# Patient Record
Sex: Male | Born: 1995 | Race: Black or African American | Hispanic: No | Marital: Single | Smoking: Never smoker
Health system: Southern US, Community
[De-identification: ages and names within clinical notes are randomized; demographics above are authoritative.]

---
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
## 2015-04-08 ENCOUNTER — Ambulatory Visit (HOSPITAL_COMMUNITY)
Admission: RE | Admit: 2015-04-08 | Discharge: 2015-04-08 | Disposition: A | Discharge status: Home / Self Care | Payer: BLUE CROSS/BLUE SHIELD | Source: Ambulatory Visit | Attending: Family Medicine | Admitting: Family Medicine

## 2015-04-08 ENCOUNTER — Other Ambulatory Visit (HOSPITAL_COMMUNITY): Payer: Self-pay | Admitting: Family Medicine

## 2015-04-08 DIAGNOSIS — M79675 Pain in left toe(s): Secondary | ICD-10-CM | POA: Insufficient documentation

## 2018-07-16 HISTORY — PX: ANTERIOR CRUCIATE LIGAMENT REPAIR: SHX115

## 2018-08-08 ENCOUNTER — Telehealth (HOSPITAL_COMMUNITY): Payer: Self-pay

## 2018-08-08 NOTE — Telephone Encounter (Signed)
I attempted to call [name] at the phone number on file and was unable to leave a voice message. [name] recently underwent Lt ACL reconstruction and requires and evaluation to assess current function and provide HEP for early rehab based on protocol. I will continue to attempt to call patient to schedule the evaluation.   Nathaniel [name], PT, DPT, [facility] Physical Therapist with [facility]  08/08/2018 12:16 PM

## 2018-08-10 ENCOUNTER — Telehealth (HOSPITAL_COMMUNITY): Payer: Self-pay

## 2018-08-10 NOTE — Telephone Encounter (Signed)
2nd attempt to schedule evaluation: I attempted to call Mr. Catlow at the phone number on file and was unable to leave a voice message. Mr. Osmani recently underwent Lt ACL reconstruction and requires and evaluation to assess current function and provide HEP for early rehab based on protocol. I will continue to attempt to call patient to schedule the evaluation.   Valentino Saxon, PT, DPT, [facility] Physical Therapist with [facility]  08/10/2018 11:29 AM

## 2018-08-13 ENCOUNTER — Encounter (HOSPITAL_COMMUNITY): Payer: Self-pay

## 2018-08-13 ENCOUNTER — Ambulatory Visit (HOSPITAL_COMMUNITY): Payer: BLUE CROSS/BLUE SHIELD | Attending: Physician Assistant

## 2018-08-13 ENCOUNTER — Other Ambulatory Visit: Payer: Self-pay

## 2018-08-13 DIAGNOSIS — M25662 Stiffness of left knee, not elsewhere classified: Secondary | ICD-10-CM | POA: Diagnosis present

## 2018-08-13 DIAGNOSIS — M25562 Pain in left knee: Secondary | ICD-10-CM | POA: Diagnosis present

## 2018-08-13 DIAGNOSIS — M6281 Muscle weakness (generalized): Secondary | ICD-10-CM | POA: Diagnosis present

## 2018-08-13 NOTE — Therapy (Signed)
[facility] [address] [place], [place], [address] Phone: [phone]   Fax:  [phone]  Physical Therapy Evaluation  Patient Details  Name: Nathaniel [name] MRN: [id number] Date of Birth: Feb 12, 1996 Referring Provider (PT): Durwin Nora Katharina Caper, [place]   Encounter Date: 08/13/2018  PT End of Session - 08/13/18 1507    Visit Number  1    Number of Visits  12    Date for PT Re-Evaluation  10/08/18    Authorization Type  [employer] Other (no auth required, 60 visits per year PT/OT combined, $20 co-pay)    Authorization Time Period  08/13/18 -    mini-re-assess 09/10/18   Authorization - Visit Number  1    Authorization - Number of Visits  60    PT Start Time  1408    PT Stop Time  1500    PT Time Calculation (min)  52 min    Activity Tolerance  Patient tolerated treatment well;No increased pain    Behavior During Therapy  [facility] for tasks assessed/performed       History reviewed. No pertinent past medical history.  Past Surgical History:  Procedure Laterality Date  . ANTERIOR CRUCIATE LIGAMENT REPAIR Left 07/16/2018    There were no vitals filed for this visit.   Subjective Assessment - 08/13/18 1413    Subjective  Nathaniel [name] denies pain today and reports he is been using ibuprofen for pain management currently. He reports he underwent Lt ACL reconstruction on 07/16/18 with a hamstring autograft. He states his injury occurred back in December at work when stepping out of his [employer] truck. He states he did not notice severe pain at the time and continued to work and go to the gym for several weeks. He states one day his knee buckled and he felt something was not right. He had an MRI and based on his activity level was recommended to have surgery. Nathaniel [name] reports he was going to the gym every day to lift weights and play basketball occasionally. 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 He reports he saw his MD last week for a 3 week follow up and they have instructed him to continue wearing his brace while walking as well as use the crutches and to wear the brace at night. He was given ankle pumps as his exercise after surgery and nothing else. He has not participated in home health PT.     Pertinent History  LT ACL Reconstruction hamstring autograft 07/16/18    Limitations  Sitting;House hold activities;Standing;Walking    Patient Stated Goals  to get back to work and the gym    Currently in Pain?  No/denies    Pain Score  0-No pain         [facility] PT Assessment - 08/13/18 0001      Assessment   Medical Diagnosis  Lt ACL Reconstruction with Hamstring Autograft    Referring Provider (PT)  Colvin Caroli, PA-C    Onset Date/Surgical Date  07/16/18    Next MD Visit  08/28/2018    Prior Therapy  none      Precautions   Precautions  Other (comment);Knee    Precaution Comments  [facility] ACL Autograft Protocol      Restrictions   Weight Bearing Restrictions  Yes    LLE Weight Bearing  Weight bearing as tolerated  Other Position/Activity Restrictions  ambulate with crutches for 3 more weeks      Balance Screen   Has the patient fallen in the past 6 months  No      Home Environment   Living Environment  Private residence    Living Arrangements  Parent;Other relatives      Prior Function   Level of Independence  Independent    Vocation  Full time employment    Vocation Requirements  Patient was working at [employer]     Leisure  Patient was going to the gym almost every day to lift weight. He also enjoys basketball and does sprint/power drills on fields occasionally      Cognition   Overall Cognitive Status  Within Functional Limits for tasks assessed      Observation/Other Assessments   Skin Integrity  Incisions appear healed and intact this date.    Focus on Therapeutic Outcomes (FOTO)   76% limited      Observation/Other  Assessments-Edema    Edema  Circumferential      Circumferential Edema   Circumferential - Right  40 cm at joint line    Circumferential - Left   41.5 cm at joint line      Posture/Postural Control   Posture/Postural Control  No significant limitations      ROM / Strength   AROM / PROM / Strength  AROM;Strength      AROM   Overall AROM Comments  Rt limited by muscle bulk of hamstring and calf    AROM Assessment Site  Knee    Right/Left Knee  Left;Right    Right Knee Extension  0    Right Knee Flexion  131    Left Knee Extension  10    Left Knee Flexion  85      Strength   Strength Assessment Site  Knee;Hip;Ankle    Right Hip Flexion  5/5    Right Hip Extension  5/5    Right Hip ABduction  5/5    Left Hip Flexion  --   unable to test due to extensor lag   Left Hip Extension  4/5    Left Hip ABduction  4/5    Right/Left Knee  Right;Left    Right Knee Flexion  5/5    Right Knee Extension  5/5    Left Knee Flexion  2+/5    Left Knee Extension  --   not tested   Right Ankle Dorsiflexion  5/5    Left Ankle Dorsiflexion  4/5      Palpation   Patella mobility  hypomobile on Lt knee         Objective measurements completed on examination: See above findings.      [facility] Adult PT Treatment/Exercise - 08/13/18 0001      Exercises   Exercises  Knee/Hip      Knee/Hip Exercises: Stretches   Knee: Self-Stretch to increase Flexion  Left;10 seconds;Limitations    Knee: Self-Stretch Limitations  10 reps, supine with rope      Knee/Hip Exercises: Aerobic   Stationary Bike  5 minutes half revolutions on seat 21 for AAROM to Lt knee      Knee/Hip Exercises: Supine   Quad Sets  Left;1 set;10 reps    Quad Sets Limitations  towel under knee, 5 sec holds    Heel Slides  Left;1 set;10 reps      Knee/Hip Exercises: Sidelying   Hip ABduction  Left;1 set;10 reps  Knee/Hip Exercises: Prone   Hamstring Curl  1 set;10 reps;3 seconds   Lt LE   Hip Extension  Left;1 set;10  reps    Hip Extension Limitations  3 sec holds        PT Education - 08/13/18 1510    Education Details  Educated on Garrisonevlauaiton and process of ACL rehab. Discussed that patient is in protection phase to be cautious with strengthening and to focus on ROM and isolated strengthening. Educated on best plan for therapy at this time and provided HEP to perform at home.     Person(s) Educated  Patient    Methods  Explanation;Handout;Demonstration    Comprehension  Verbalized understanding;Returned demonstration       PT Short Term Goals - 08/13/18 1544      PT SHORT TERM GOAL #1   Title  Patient will be independent with HEP, updated PRN, toprogress safely through rehab protocol.     Time  1    Period  Weeks    Status  New    Target Date  08/20/18      PT SHORT TERM GOAL #2   Title  Patient will achieve AROM 0-100 to progress safely to next phase of protocol for strengthening and mobility/ROM exercises.     Time  3    Period  Weeks    Status  New    Target Date  09/03/18      PT SHORT TERM GOAL #3   Title  Patient will demonstrate normal gait pattern with no brace ro crutches to indicate good quad control and tolerance to FWB to improve mobility in daily activities and progress to next phase of protocol.     Time  4    Period  Weeks    Status  New    Target Date  09/10/18        PT Long Term Goals - 08/13/18 1549      PT LONG TERM GOAL #1   Title  Patient will achieve 0-130 degrees on Lt knee AROM to equal Rt Knee mobility to imrpove fucntional giat and mobility.     Time  6    Period  Weeks    Status  New    Target Date  09/24/18      PT LONG TERM GOAL #2   Title  Patient will demosntrate 70% hamstring strength on Lt knee compared to Rt knee to indicate imrpovements in functional strength with 1 rep max test.     Time  8    Period  Weeks    Status  New    Target Date  10/08/18      PT LONG TERM GOAL #3   Title  Patient will demosntrate 60% quadriceps strength on Lt  knee compared to Rt knee to indicate imrpovements in functional strength with 1 rep max test.     Time  8    Period  Weeks    Status  New    Target Date  10/08/18        Plan - 08/13/18 1513    Clinical Impression Statement  Nathaniel [name] presents to physical therapy for evaluation 4 weeks s/p Lt ACL reconstruction with hamstring autograft. He presents with significant atrophy to Lt quadriceps, hamstring, and gastrocsoleus complex. He has been in immobilizer for 4 weeks and ambulating with crutches and has been instructed to continue WBAT with immobilizer and crutches for additional 3 weeks. Lt knee AROM is currently limited form  10-85 degrees and he has mild edema present at Lt knee. His incisions are healing well and no erythema or warmth is present at joint. He was educated on initial HEP to address ROM deficits and initiate quad, hamstrings, and hip strengthening within protocol. He will be seen 1x/week in clinic due to reduced clinic hours related to COVID-19 spread and will increase frequency as he progresses towards more advanced strengthening phases. Nathaniel [name] will benefit from skilled PT interventions to progress safely through ACL Reconstruction protocol to return to work and recreational exercises routine.    Personal Factors and Comorbidities  Profession    Examination-Activity Limitations  Bathing;Stairs;Sit;Stand;Transfers    Examination-Participation Restrictions  Driving;Yard Work;Cleaning;Community Activity;Laundry;Other   work   Stability/Clinical Decision Making  Stable/Uncomplicated    Clinical Decision Making  Low    Rehab Potential  Excellent    PT Frequency  2x / week   1x/week for 4, 2x/week for 4   PT Duration  8 weeks    PT Treatment/Interventions  ADLs/Self Care Home Management;Aquatic Therapy;Cryotherapy;Electrical Stimulation;Moist Heat;DME Instruction;Gait training;Stair training;Functional mobility training;Therapeutic activities;Therapeutic exercise;Balance  training;Neuromuscular re-education;Patient/family education;Manual techniques;Scar mobilization;Passive range of motion;Taping    PT Next Visit Plan  Review HEP and continue with bike for AROM. 4 weeks post-op on 08/13/18; follow [facility] orthopedic ACL Autograft protocol. (Initiate patella mobs, retrograde massage, quad set with e-stim) add prone knee hang to HEP for knee extension.    PT Home Exercise Plan  Eval: quad set, heel slide, supine knee flexion stretch with rope, hip extension, hamsring curl, hip abduction, heel raises    Consulted and Agree with Plan of Care  Patient       Patient will benefit from skilled therapeutic intervention in order to improve the following deficits and impairments:  Abnormal gait, Decreased activity tolerance, Decreased endurance, Decreased range of motion, Decreased strength, Increased edema, Difficulty walking, Decreased mobility, Decreased balance, Increased fascial restricitons  Visit Diagnosis: Stiffness of left knee, not elsewhere classified  Acute pain of left knee  Muscle weakness (generalized)     Problem List Patient Active Problem List   Diagnosis Date Noted  . CONTUSION OF FOREARM 03/17/2009    Valentino Saxon, PT, DPT, [facility] Physical Therapist with [facility]  08/13/2018 4:00 PM    [facility] [address] [place], [place], [address] Phone: [phone]   Fax:  [phone]  Name: CAROLD EISNER MRN: [id number] Date of Birth: [birth year]

## 2018-08-15 ENCOUNTER — Ambulatory Visit (HOSPITAL_COMMUNITY): Payer: BLUE CROSS/BLUE SHIELD

## 2018-08-17 ENCOUNTER — Ambulatory Visit (HOSPITAL_COMMUNITY): Payer: BLUE CROSS/BLUE SHIELD | Admitting: Physical Therapy

## 2018-08-20 ENCOUNTER — Encounter (HOSPITAL_COMMUNITY): Payer: Self-pay

## 2018-08-20 ENCOUNTER — Ambulatory Visit (HOSPITAL_COMMUNITY): Payer: BLUE CROSS/BLUE SHIELD | Attending: Physician Assistant

## 2018-08-20 ENCOUNTER — Other Ambulatory Visit: Payer: Self-pay

## 2018-08-20 DIAGNOSIS — M25562 Pain in left knee: Secondary | ICD-10-CM | POA: Diagnosis present

## 2018-08-20 DIAGNOSIS — M6281 Muscle weakness (generalized): Secondary | ICD-10-CM | POA: Diagnosis present

## 2018-08-20 DIAGNOSIS — M25662 Stiffness of left knee, not elsewhere classified: Secondary | ICD-10-CM | POA: Diagnosis not present

## 2018-08-20 NOTE — Therapy (Signed)
[facility] [address] [place], [place], [address] Phone: [phone]   Fax:  [phone]  Physical Therapy Treatment  Patient Details  Name: Nathaniel [name] MRN: [id number] Date of Birth: 02/24/1996 Referring Provider (PT): Durwin Nora Katharina Caper, [place]   Encounter Date: 08/20/2018  PT End of Session - 08/20/18 1157    Visit Number  2    Number of Visits  12    Date for PT Re-Evaluation  10/08/18    Authorization Type  BC[employer] Other (no auth required, 60 visits per year PT/OT combined, $20 co-pay)    Authorization Time Period  08/13/18 - 10/08/18   mini-re-assess 09/10/18   Authorization - Visit Number  2    Authorization - Number of Visits  60    PT Start Time  1120    PT Stop Time  1203    PT Time Calculation (min)  43 min    Activity Tolerance  Patient tolerated treatment well;No increased pain    Behavior During Therapy  [facility] for tasks assessed/performed       History reviewed. No pertinent past medical history.  Past Surgical History:  Procedure Laterality Date  . ANTERIOR CRUCIATE LIGAMENT REPAIR Left 07/16/2018    There were no vitals filed for this visit.  Subjective Assessment - 08/20/18 1123    Subjective  Patient reports he is doing his HEP 2x/day and he denies any pain. He is elevating and icing his knee frequently throughout the day.     Pertinent History  LT ACL Reconstruction hamstring autograft 07/16/18    Limitations  Sitting;House hold activities;Standing;Walking    Patient Stated Goals  to get back to work and the gym    Currently in Pain?  No/denies       [facility] Adult PT Treatment/Exercise - 08/20/18 0001      Exercises   Exercises  Knee/Hip      Knee/Hip Exercises: Aerobic   Stationary Bike  5 minutes half revolutions on seat 21 for AAROM to Lt knee      Knee/Hip Exercises: Seated   Other Seated Knee/Hip Exercises  Hamstring isometric: Lt LE, 1x 10 reps 10 sec holds      Knee/Hip Exercises: Supine    Quad Sets  Strengthening;Left;1 set   12 reps (during NMES)   Quad Sets Limitations  towel under knee    Heel Slides  Left;1 set;15 reps    Knee Extension  AROM    Knee Extension Limitations  5    Knee Flexion  AROM    Knee Flexion Limitations  86      Knee/Hip Exercises: Prone   Hamstring Curl  1 set;3 seconds;15 reps    Hamstring Curl Limitations  Lt LE    Hip Extension  Left;1 set;15 reps    Hip Extension Limitations  Lt LE, 3 sec holds      Modalities   Modalities  Geologist, engineering Location  Lt Quadriceps    Electrical Stimulation Action  Neuromuscular re-education and strenghtening during supine quad set with towel under knee    Electrical Stimulation Parameters  8 minutes: [place], con-contract (2 channels), 17.0 AmpCC, 50% duty cycle, 10/30 on/off    Electrical Stimulation Goals  Strength;Neuromuscular facilitation      Manual Therapy   Manual Therapy  Joint mobilization    Manual therapy comments  performed seperate from other interventions    Joint Mobilization  Lt patellofemoral joint mobilization; 3x 30 sec each direction (med/lat, inf/sup); self instruction for patient to perform at home for HEP        PT Education - 08/20/18 1200    Education Details  Educated on exercises throughout and on HEP update.    Person(s) Educated  Patient    Methods  Explanation;Demonstration;Handout    Comprehension  Verbalized understanding;Returned demonstration       PT Short Term Goals - 08/13/18 1544      PT SHORT TERM GOAL #1   Title  Patient will be independent with HEP, updated PRN, toprogress safely through rehab protocol.     Time  1    Period  Weeks    Status  New    Target Date  08/20/18      PT SHORT TERM GOAL #2   Title  Patient will achieve AROM 0-100 to progress safely to next phase of protocol for strengthening and mobility/ROM exercises.     Time  3    Period  Weeks    Status  New    Target Date   09/03/18      PT SHORT TERM GOAL #3   Title  Patient will demonstrate normal gait pattern with no brace ro crutches to indicate good quad control and tolerance to FWB to improve mobility in daily activities and progress to next phase of protocol.     Time  4    Period  Weeks    Status  New    Target Date  09/10/18        PT Long Term Goals - 08/13/18 1549      PT LONG TERM GOAL #1   Title  Patient will achieve 0-130 degrees on Lt knee AROM to equal Rt Knee mobility to imrpove fucntional giat and mobility.     Time  6    Period  Weeks    Status  New    Target Date  09/24/18      PT LONG TERM GOAL #2   Title  Patient will demosntrate 70% hamstring strength on Lt knee compared to Rt knee to indicate imrpovements in functional strength with 1 rep max test.     Time  8    Period  Weeks    Status  New    Target Date  10/08/18      PT LONG TERM GOAL #3   Title  Patient will demosntrate 60% quadriceps strength on Lt knee compared to Rt knee to indicate imrpovements in functional strength with 1 rep max test.     Time  8    Period  Weeks    Status  New    Target Date  10/08/18        Plan - 08/20/18 1356    Clinical Impression Statement  Reviewed exercises for HEP and continued focus for quad strengtheing with quad set using NMES to facilitate greater muscle activaiton. Performe dexercies focused on ROM deficits and hamstring strengthening. Introduced hamsring isometric which patient reported was easier than OKC in prone, and educated patient on how to perform self patella mobilizations. He will continue to benefit from skilled PT Interventions toa ddress impairments and progress through protocol safely.     Personal Factors and Comorbidities  Profession    Examination-Activity Limitations  Bathing;Stairs;Sit;Stand;Transfers    Examination-Participation Restrictions  Driving;Yard Work;Cleaning;Community Activity;Laundry;Other   work   Stability/Clinical Decision Making   Stable/Uncomplicated    Rehab Potential  Excellent  PT Frequency  2x / week   1x/week for 4, 2x/week for 4   PT Duration  8 weeks    PT Treatment/Interventions  ADLs/Self Care Home Management;Aquatic Therapy;Cryotherapy;Electrical Stimulation;Moist Heat;DME Instruction;Gait training;Stair training;Functional mobility training;Therapeutic activities;Therapeutic exercise;Balance training;Neuromuscular re-education;Patient/family education;Manual techniques;Scar mobilization;Passive range of motion;Taping    PT Next Visit Plan  Review HEP and continue with bike for AROM. 5 weeks post-op on 08/20/18; follow [facility] orthopedic ACL Autograft protocol. Follow up on patella mobs. Perform retrograde massage PRN, continue quad set with e-stim. Progress per protocol.    PT Home Exercise Plan  Eval: quad set, heel slide, supine knee flexion stretch with rope, hip extension, hamsring curl, hip abduction, heel raises; 08/20/18 - patella mobilization, hamstring isometric, prone knee hang    Consulted and Agree with Plan of Care  Patient       Patient will benefit from skilled therapeutic intervention in order to improve the following deficits and impairments:  Abnormal gait, Decreased activity tolerance, Decreased endurance, Decreased range of motion, Decreased strength, Increased edema, Difficulty walking, Decreased mobility, Decreased balance, Increased fascial restricitons  Visit Diagnosis: Stiffness of left knee, not elsewhere classified  Acute pain of left knee  Muscle weakness (generalized)     Problem List Patient Active Problem List   Diagnosis Date Noted  . CONTUSION OF FOREARM 03/17/2009    Valentino Saxon, PT, DPT, [facility] Physical Therapist with [facility]  08/20/2018 2:03 PM    [facility] [address] [place], [place], [address] Phone: [phone]   Fax:  [phone]  Name: BRANKO ALLOR MRN:  [id number] Date of Birth: [birth year]

## 2018-08-20 NOTE — Patient Instructions (Addendum)
Long Sitting 4 Way Patellar Glide reps: 3 hold: 60 seconds each direction daily: 2 weekly: 7   Exercise image step 1   Exercise image step 2   Exercise image step 3   Exercise image step 4   Exercise image step 5  Setup  Begin sitting upright with your legs straight. Movement  Place your fingers around your kneecap and gently move it inward. Hold briefly, then return to the starting position and repeat moving your knee cap outward, up, then down. Tip  Make sure to keep your leg muscles relaxed during the exercise. Prone Knee Extension Hang hold: 10 minutes daily: 1-2 weekly: 7   Exercise image step 1  Setup  Begin lying on your front with your knee and lower leg hanging off the edge of a table or bed Movement  Let your lower leg hang toward the floor, straightening your knee. Tip  Make sure not to arch your back during the exercise.   Seated Hamstring Set reps: 10 sets: 2 hold: 8 seconds daily: 1-2  weekly: 7      Exercise image step 1   Exercise image step 2  Setup  Begin sitting upright on the edge of a chair with your involved knee slightly bent. Movement  Pull your heel down into the ground tightening the muscles in the back of your thigh. Hold briefly, then relax and repeat. Tip  Make sure to keep your back straight and do not let your knee fall inward or outward.

## 2018-08-22 ENCOUNTER — Encounter (HOSPITAL_COMMUNITY): Payer: BLUE CROSS/BLUE SHIELD

## 2018-08-27 ENCOUNTER — Encounter (HOSPITAL_COMMUNITY): Payer: Self-pay | Admitting: Physical Therapy

## 2018-08-27 ENCOUNTER — Ambulatory Visit (HOSPITAL_COMMUNITY): Payer: BLUE CROSS/BLUE SHIELD | Admitting: Physical Therapy

## 2018-08-27 ENCOUNTER — Other Ambulatory Visit: Payer: Self-pay

## 2018-08-27 DIAGNOSIS — M6281 Muscle weakness (generalized): Secondary | ICD-10-CM

## 2018-08-27 DIAGNOSIS — M25562 Pain in left knee: Secondary | ICD-10-CM

## 2018-08-27 DIAGNOSIS — M25662 Stiffness of left knee, not elsewhere classified: Secondary | ICD-10-CM | POA: Diagnosis not present

## 2018-08-27 NOTE — Patient Instructions (Addendum)
Heel Raise: Bilateral (Standing)    Rise on balls of feet. Repeat _10-15___ times per set. Do _1___ sets per session. Do _2___ sessions per day.  http://orth.exer.us/38   Copyright  VHI. All rights reserved.  Functional Quadriceps: Chair Squat    Keeping feet flat on floor, shoulder width apart, squat as low as is comfortable. Use support as necessary. Repeat __10__ times per set. Do __1__ sets per session. Do __2__ sessions per day.  http://orth.exer.us/736   Copyright  VHI. All rights reserved.  Balance: Unilateral    Attempt to balance on left leg, eyes open. Hold ____ seconds. Repeat ____ times per set. Do ____ sets per session. Do ____ sessions per day. Perform exercise with eyes closed.  http://orth.exer.us/28   Copyright  VHI. All rights reserved.  Knee Flexion: Resisted (Standing)    With support, __0__ pound weight around right ankle, slowly bend knee up. Return slowly.  Repeat _10___ times per set. Do 1____ sets per session. Do __2__ sessions per day.  http://orth.exer.us/740   Copyright  VHI. All rights reserved.  Knee Extension Mobilization: Towel Prop    With rolled towel under right ankle, place _2___ pound weight across knee. Hold __5-30__ minutes. Repeat ___1_ times per set. Do __1__ sets per session. Do __2_ sessions per day.  http://orth.exer.us/720   Copyright  VHI. All rights reserved.

## 2018-08-27 NOTE — Therapy (Signed)
Nathaniel [name], [place], [address] Phone: [phone]   Fax:  [phone]  Physical Therapy Treatment  Patient Details  Name: Nathaniel [name] MRN: [id number] Date of Birth: 1995/08/24 Referring Provider (PT): Doren Custard Robb Matar, [place]   Encounter Date: 08/27/2018  PT End of Session - 08/27/18 1354    Visit Number  3    Number of Visits  12    Date for PT Re-Evaluation  10/08/18    Authorization Type  BCBS Other ([name] auth required, 60 visits per year PT/OT combined, $20 co-pay)    Authorization Time Period  08/13/18 - 10/08/18   mini-re-assess 09/10/18   Authorization - Visit Number  3    Authorization - Number of Visits  60    PT Start Time  1300    PT Stop Time  1404    PT Time Calculation (min)  64 min    Activity Tolerance  Patient tolerated treatment well;[name] increased pain    Behavior During Therapy  [facility] for tasks assessed/performed       History reviewed. [name] pertinent past medical history.  Past Surgical History:  Procedure Laterality Date  . ANTERIOR CRUCIATE LIGAMENT REPAIR Left 07/16/2018    There were [name] vitals filed for this visit.  Subjective Assessment - 08/27/18 1301    Subjective  Pt states that his knee is sore every now and the  but it is feeling better.     Pertinent History  LT ACL Reconstruction hamstring autograft 07/16/18    Limitations  Sitting;House hold activities;Standing;Walking    Patient Stated Goals  to get back to work and the gym    Currently in Pain?  [name]/denies                 [facility] Adult PT Treatment/Exercise - 08/27/18 0001      Exercises   Exercises  Knee/Hip      Knee/Hip Exercises: Stretches   Quad Stretch  Left;2 reps;20 seconds      Knee/Hip Exercises: Standing   Heel Raises  Both;10 reps    Knee Flexion  Left;10 reps    Functional Squat  10 reps    SLS  x3 B       Knee/Hip Exercises: Supine   Quad Sets  2 sets;15 reps    Heel Slides  Left;15  reps    Knee Extension  AROM    Knee Extension Limitations  3    Knee Flexion  AROM    Knee Flexion Limitations  96      Knee/Hip Exercises: Prone   Hamstring Curl  10 reps      Modalities   Modalities  Electrical Stimulation      Electrical Stimulation   Electrical Stimulation Location  Lt Quadriceps   All 4 electrodes with co contraction.   Social research officer, government Parameters  russian stim x 15'    Printmaker Goals  Strength;Neuromuscular facilitation      Manual Therapy   Manual Therapy  Joint mobilization    Manual therapy comments  performed seperate from other interventions    Joint Mobilization  Lt patellofemoral joint mobilization; 3x 30 sec each direction (med/lat, inf/sup); self instruction for patient to perform at home for HEP               PT Short Term Goals - 08/27/18 1401      PT SHORT TERM  GOAL #1   Title  Patient will be independent with HEP, updated PRN, toprogress safely through rehab protocol.     Time  1    Period  Weeks    Status  On-going    Target Date  08/20/18      PT SHORT TERM GOAL #2   Title  Patient will achieve AROM 0-100 to progress safely to next phase of protocol for strengthening and mobility/ROM exercises.     Time  3    Period  Weeks    Status  Not Met    Target Date  09/03/18      PT SHORT TERM GOAL #3   Title  Patient will demonstrate normal gait pattern with [name] brace ro crutches to indicate good quad control and tolerance to FWB to improve mobility in daily activities and progress to next phase of protocol.     Time  4    Period  Weeks    Status  Not Met    Target Date  09/10/18        PT Long Term Goals - 08/27/18 1401      PT LONG TERM GOAL #1   Title  Patient will achieve 0-130 degrees on Lt knee AROM to equal Rt Knee mobility to imrpove fucntional giat and mobility.     Time  6    Period  Weeks    Status  Not Met      PT LONG TERM GOAL #2   Title   Patient will demosntrate 70% hamstring strength on Lt knee compared to Rt knee to indicate imrpovements in functional strength with 1 rep max test.     Time  8    Period  Weeks    Status  Not Met      PT LONG TERM GOAL #3   Title  Patient will demosntrate 60% quadriceps strength on Lt knee compared to Rt knee to indicate imrpovements in functional strength with 1 rep max test.     Time  8    Period  Weeks    Status  Not Met            Plan - 08/27/18 1355    Clinical Impression Statement  Pt returns with limited improvement in ROM and noted quadricep atrophy.  PT given instructions that he needs to improve his flexion more rapidly; also instructed to complete 100 quad sets per day to offset current atrophy.  Pt is returning to MD tomorrow and hopefully will be able to decrease use of crutches .  Pt continues to need skilled therapy to improve ROM, strength and normalize gait.  If pt is not at 105 or higher by next visit we will have pt  come in twice a week.      Personal Factors and Comorbidities  Profession    Examination-Activity Limitations  Bathing;Stairs;Sit;Stand;Transfers    Examination-Participation Restrictions  Driving;Yard Work;Cleaning;Community Activity;Laundry;Other   work   Stability/Clinical Decision Making  Stable/Uncomplicated    Rehab Potential  Excellent    PT Frequency  2x / week   1x/week for 4, 2x/week for 4   PT Duration  8 weeks    PT Treatment/Interventions  ADLs/Self Care Home Management;Aquatic Therapy;Cryotherapy;Electrical Stimulation;Moist Heat;DME Instruction;Gait training;Stair training;Functional mobility training;Therapeutic activities;Therapeutic exercise;Balance training;Neuromuscular re-education;Patient/family education;Manual techniques;Scar mobilization;Passive range of motion;Taping    PT Next Visit Plan   continue with bike for AROM. 5 weeks post-op on 08/20/18; follow [facility] orthopedic ACL Autograft protocol. Follow up on  patella mobs.  Perform retrograde massage PRN, continue quad set with e-stim. Progress per protocol.    PT Home Exercise Plan  Eval: quad set, heel slide, supine knee flexion stretch with rope, hip extension, hamsring curl, hip abduction, heel raises; 08/20/18 - patella mobilization, hamstring isometric, prone knee hang    Consulted and Agree with Plan of Care  Patient       Patient will benefit from skilled therapeutic intervention in order to improve the following deficits and impairments:  Abnormal gait, Decreased activity tolerance, Decreased endurance, Decreased range of motion, Decreased strength, Increased edema, Difficulty walking, Decreased mobility, Decreased balance, Increased fascial restricitons  Visit Diagnosis: Stiffness of left knee, not elsewhere classified  Acute pain of left knee  Muscle weakness (generalized)     Problem List Patient Active Problem List   Diagnosis Date Noted  . CONTUSION OF FOREARM 03/17/2009  Rayetta Humphrey, PT CLT [phone] 08/27/2018, 2:02 PM  [place] [address] [place], [place], [address] Phone: [phone]   Fax:  [phone]  Name: Nathaniel [name] MRN: [id number] Date of Birth: Feb 27, 1996

## 2018-09-03 ENCOUNTER — Other Ambulatory Visit: Payer: Self-pay

## 2018-09-03 ENCOUNTER — Encounter (HOSPITAL_COMMUNITY): Payer: Self-pay | Admitting: Physical Therapy

## 2018-09-03 ENCOUNTER — Ambulatory Visit (HOSPITAL_COMMUNITY): Payer: BLUE CROSS/BLUE SHIELD | Admitting: Physical Therapy

## 2018-09-03 DIAGNOSIS — M25662 Stiffness of left knee, not elsewhere classified: Secondary | ICD-10-CM | POA: Diagnosis not present

## 2018-09-03 DIAGNOSIS — M25562 Pain in left knee: Secondary | ICD-10-CM

## 2018-09-03 DIAGNOSIS — M6281 Muscle weakness (generalized): Secondary | ICD-10-CM

## 2018-09-03 NOTE — Therapy (Signed)
[place], [place], [address] Phone: [phone]   Fax:  [phone]  Physical Therapy Treatment  Patient Details  Name: Nathaniel [name] MRN: [id number] Date of Birth: [birth year] Referring Provider (PT): Doren Custard Robb Matar, [place]   Encounter Date: 09/03/2018  PT End of Session - 09/03/18 1438    Visit Number  4    Number of Visits  12    Date for PT Re-Evaluation  10/08/18    Authorization Type  [employer] Other (no auth required, 60 visits per year PT/OT combined, $20 co-pay)    Authorization Time Period  08/13/18 - 10/08/18   mini-re-assess 09/10/18   Authorization - Visit Number  4    Authorization - Number of Visits  60    PT Start Time  7915    PT Stop Time  1400    PT Time Calculation (min)  54 min    Activity Tolerance  Patient tolerated treatment well;No increased pain    Behavior During Therapy  [facility] for tasks assessed/performed       History reviewed. No pertinent past medical history.  Past Surgical History:  Procedure Laterality Date  . ANTERIOR CRUCIATE LIGAMENT REPAIR Left 07/16/2018    There were no vitals filed for this visit.  Subjective Assessment - 09/03/18 1313    Subjective  no pain or issues, just a little soreness    Currently in Pain?  No/denies                       [facility] Adult PT Treatment/Exercise - 09/03/18 0001      Knee/Hip Exercises: Stretches   Active Hamstring Stretch  Left;3 reps;30 seconds    Active Hamstring Stretch Limitations  12" box standing    Knee: Self-Stretch to increase Flexion  Left;10 seconds    Knee: Self-Stretch Limitations  10 reps on 12" step      Knee/Hip Exercises: Aerobic   Stationary Bike  5 minutes half revolutions on seat 21 for AAROM to Lt knee      Knee/Hip Exercises: Machines for Strengthening   Cybex Leg Press  4 PL 10 reps (bil LE)      Knee/Hip Exercises: Standing   Heel Raises  Both;10 reps    Knee Flexion  Left;10  reps;2 sets    Knee Flexion Limitations  10 reps from floor, 10 reps terminal end ROM using 6" step    Functional Squat  10 reps    SLS  30" max      Knee/Hip Exercises: Supine   Quad Sets  2 sets;15 reps    Short Arc Quad Sets  Limitations    Short Arc Quad Sets Limitations  done with NMES    Heel Slides  Left;15 reps    Straight Leg Raises  Left;10 reps;Limitations    Straight Leg Raises Limitations  cues for QS prior to decrease extension lag    Knee Extension  AROM    Knee Extension Limitations  3    Knee Flexion  AROM    Knee Flexion Limitations  101    Other Supine Knee/Hip Exercises  AAROM 106      Modalities   Modalities  Electrical Stimulation      Electrical Stimulation   Electrical Stimulation Location  Lt Quadriceps    Electrical Stimulation Action  muscular re education    Electrical Stimulation Parameters  10 minutes, Russian Co-contraction, 50% duty cycle, 10/20 on/off, 27  mAcc    Electrical Stimulation Goals  Strength;Neuromuscular facilitation      Manual Therapy   Manual Therapy  Myofascial release    Manual therapy comments  performed prior to ROM, seperate from other interventions    Myofascial Release  to reduce adhesions, myofascial restrictions, improve ROM               PT Short Term Goals - 08/27/18 1401      PT SHORT TERM GOAL #1   Title  Patient will be independent with HEP, updated PRN, toprogress safely through rehab protocol.     Time  1    Period  Weeks    Status  On-going    Target Date  08/20/18      PT SHORT TERM GOAL #2   Title  Patient will achieve AROM 0-100 to progress safely to next phase of protocol for strengthening and mobility/ROM exercises.     Time  3    Period  Weeks    Status  Not Met    Target Date  09/03/18      PT SHORT TERM GOAL #3   Title  Patient will demonstrate normal gait pattern with no brace ro crutches to indicate good quad control and tolerance to FWB to improve mobility in daily activities and  progress to next phase of protocol.     Time  4    Period  Weeks    Status  Not Met    Target Date  09/10/18        PT Long Term Goals - 08/27/18 1401      PT LONG TERM GOAL #1   Title  Patient will achieve 0-130 degrees on Lt knee AROM to equal Rt Knee mobility to imrpove fucntional giat and mobility.     Time  6    Period  Weeks    Status  Not Met      PT LONG TERM GOAL #2   Title  Patient will demosntrate 70% hamstring strength on Lt knee compared to Rt knee to indicate imrpovements in functional strength with 1 rep max test.     Time  8    Period  Weeks    Status  Not Met      PT LONG TERM GOAL #3   Title  Patient will demosntrate 60% quadriceps strength on Lt knee compared to Rt knee to indicate imrpovements in functional strength with 1 rep max test.     Time  8    Period  Weeks    Status  Not Met            Plan - 09/03/18 1439    Clinical Impression Statement  Began with bike, however unable to make full revolutions still at this point.  Instructed with Standing hamstring and knee flexion stretches, added terminal knee flexion using 6" box and began leg press machine.   Continued with established therex and completed myofascial technqiues prior to A/AROM.  Palpable scar tissue at lateral and medial incision sites, reduced with manual techniques.  Able to get improved A/AROM today of 101/106 degrees.  Extension remained at lacking 3 degrees.  Pt was able to complete SLR today with minimal extension lag noting improving quad stregnthening.  Continued with NMES while completing SAQ.      Personal Factors and Comorbidities  Profession    Examination-Activity Limitations  Bathing;Stairs;Sit;Stand;Transfers    Examination-Participation Restrictions  Driving;Yard Work;Cleaning;Community Activity;Laundry;Other   work   Stability/Clinical  Decision Making  Stable/Uncomplicated    Rehab Potential  Excellent    PT Frequency  2x / week   1x/week for 4, 2x/week for 4   PT  Duration  8 weeks    PT Treatment/Interventions  ADLs/Self Care Home Management;Aquatic Therapy;Cryotherapy;Electrical Stimulation;Moist Heat;DME Instruction;Gait training;Stair training;Functional mobility training;Therapeutic activities;Therapeutic exercise;Balance training;Neuromuscular re-education;Patient/family education;Manual techniques;Scar mobilization;Passive range of motion;Taping    PT Next Visit Plan   continue with bike for AROM with attempts to make full revolutions next session.  Pt is  7 weeks post-op today ( 09/03/18). Follow [facility] orthopedic ACL Autograft protocol. Continue with manual to help loosen tissue and improve ROM.      PT Home Exercise Plan  Eval: quad set, heel slide, supine knee flexion stretch with rope, hip extension, hamsring curl, hip abduction, heel raises; 08/20/18 - patella mobilization, hamstring isometric, prone knee hang    Consulted and Agree with Plan of Care  Patient       Patient will benefit from skilled therapeutic intervention in order to improve the following deficits and impairments:  Abnormal gait, Decreased activity tolerance, Decreased endurance, Decreased range of motion, Decreased strength, Increased edema, Difficulty walking, Decreased mobility, Decreased balance, Increased fascial restricitons  Visit Diagnosis: Stiffness of left knee, not elsewhere classified  Acute pain of left knee  Muscle weakness (generalized)     Problem List Patient Active Problem List   Diagnosis Date Noted  . CONTUSION OF FOREARM 03/17/2009   Teena Irani, PTA/CLT [phone]  Teena Irani 09/03/2018, 2:48 PM  [place] [address] [place], [place], [address] Phone: [phone]   Fax:  [phone]  Name: HEAVEN WANDELL MRN: [id number] Date of Birth: July 04, 1995

## 2018-09-07 ENCOUNTER — Telehealth (HOSPITAL_COMMUNITY): Payer: Self-pay

## 2018-09-07 NOTE — Telephone Encounter (Signed)
I attempted to call Mr. Nathaniel [name] regarding his new appointment schedule. I adjusted his schedule in order to condense our office hours. The service provider response was "your call can not be completed at this time". I will print and provide his new schedule at his next appointment.   Valentino Saxon, PT, DPT, [facility] Physical Therapist with [facility]  09/07/2018 11:40 AM

## 2018-09-10 ENCOUNTER — Ambulatory Visit (HOSPITAL_COMMUNITY): Payer: BLUE CROSS/BLUE SHIELD | Admitting: Physical Therapy

## 2018-09-10 ENCOUNTER — Other Ambulatory Visit: Payer: Self-pay

## 2018-09-10 DIAGNOSIS — M25662 Stiffness of left knee, not elsewhere classified: Secondary | ICD-10-CM | POA: Diagnosis not present

## 2018-09-10 DIAGNOSIS — M25562 Pain in left knee: Secondary | ICD-10-CM

## 2018-09-10 DIAGNOSIS — M6281 Muscle weakness (generalized): Secondary | ICD-10-CM

## 2018-09-10 NOTE — Therapy (Signed)
[place], [place], [address] Phone: [phone]   Fax:  [phone]  Physical Therapy Treatment  Patient Details  Name: Nathaniel [name] MRN: [id number] Date of Birth: [birth year] Referring Provider (PT): Doren Custard Robb Matar, [place]   Encounter Date: 09/10/2018  PT End of Session - 09/10/18 1232    Visit Number  5    Number of Visits  12    Date for PT Re-Evaluation  10/08/18    Authorization Type  [employer] Other (no auth required, 60 visits per year PT/OT combined, $20 co-pay)    Authorization Time Period  08/13/18 - 10/08/18   mini-re-assess 09/10/18   Authorization - Visit Number  5    Authorization - Number of Visits  60    PT Start Time  1696    PT Stop Time  1206    PT Time Calculation (min)  48 min    Activity Tolerance  Patient tolerated treatment well;No increased pain    Behavior During Therapy  [facility] for tasks assessed/performed       No past medical history on file.  Past Surgical History:  Procedure Laterality Date  . ANTERIOR CRUCIATE LIGAMENT REPAIR Left 07/16/2018    There were no vitals filed for this visit.  Subjective Assessment - 09/10/18 1123    Subjective  pt states he is doing well today without complaints.  Continues to wear the knee immobilizer    Currently in Pain?  No/denies                       [facility] Adult PT Treatment/Exercise - 09/10/18 0001      Knee/Hip Exercises: Aerobic   Stationary Bike  beginning of session: 5 minutes full revolutions on seat 21 for AAROM to Lt knee    Elliptical  following bike: 1 minute forward, 1 minute backward      Knee/Hip Exercises: Machines for Strengthening   Cybex Leg Press  4 PL 10 reps (bil LE)      Knee/Hip Exercises: Standing   Heel Raises  Both;15 reps    Knee Flexion  Left;2 sets;15 reps    Forward Lunges  Left;10 reps;Limitations    Forward Lunges Limitations  onto 4" step no UE's    Terminal Knee Extension   Left;2 sets;10 reps;Theraband;Limitations    Theraband Level (Terminal Knee Extension)  Level 4 (Blue)    Terminal Knee Extension Limitations  given band for HEP    Functional Squat  15 reps    Wall Squat  5 reps;10 seconds      Knee/Hip Exercises: Supine   Quad Sets  2 sets;15 reps    Short Arc Quad Sets  Limitations    Short Arc Quad Sets Limitations  done with NMES    Heel Slides  Left;10 reps    Straight Leg Raises  Left;10 reps;Limitations    Straight Leg Raises Limitations  cues for QS prior to decrease extension lag (8 degree)    Knee Extension  AROM    Knee Extension Limitations  0    Knee Flexion  AROM    Knee Flexion Limitations  115      Modalities   Modalities  Electrical Stimulation      Electrical Stimulation   Electrical Stimulation Location  Lt Quadriceps    Electrical Stimulation Action  mm re-education    Electrical Stimulation Parameters  10 minutes, Russian Co-contraction, 50% duty cycle, 10/20 on/off, 27  mAcc    Electrical Stimulation Goals  Strength;Neuromuscular facilitation      Manual Therapy   Manual Therapy  Myofascial release    Manual therapy comments  performed prior to ROM, seperate from other interventions    Myofascial Release  to reduce adhesions, myofascial restrictions, improve ROM               PT Short Term Goals - 08/27/18 1401      PT SHORT TERM GOAL #1   Title  Patient will be independent with HEP, updated PRN, toprogress safely through rehab protocol.     Time  1    Period  Weeks    Status  On-going    Target Date  08/20/18      PT SHORT TERM GOAL #2   Title  Patient will achieve AROM 0-100 to progress safely to next phase of protocol for strengthening and mobility/ROM exercises.     Time  3    Period  Weeks    Status  Not Met    Target Date  09/03/18      PT SHORT TERM GOAL #3   Title  Patient will demonstrate normal gait pattern with no brace ro crutches to indicate good quad control and tolerance to FWB to improve  mobility in daily activities and progress to next phase of protocol.     Time  4    Period  Weeks    Status  Not Met    Target Date  09/10/18        PT Long Term Goals - 08/27/18 1401      PT LONG TERM GOAL #1   Title  Patient will achieve 0-130 degrees on Lt knee AROM to equal Rt Knee mobility to imrpove fucntional giat and mobility.     Time  6    Period  Weeks    Status  Not Met      PT LONG TERM GOAL #2   Title  Patient will demosntrate 70% hamstring strength on Lt knee compared to Rt knee to indicate imrpovements in functional strength with 1 rep max test.     Time  8    Period  Weeks    Status  Not Met      PT LONG TERM GOAL #3   Title  Patient will demosntrate 60% quadriceps strength on Lt knee compared to Rt knee to indicate imrpovements in functional strength with 1 rep max test.     Time  8    Period  Weeks    Status  Not Met            Plan - 09/10/18 1233    Clinical Impression Statement  Pt able to make full revolutions on bike today.  Began on elliptical, per protocol, completing 1 minute forward and retro.  Also added wall sits, TKE with BTB and lunges on elevated surface all to assist with strengthening quadricep mm.  AROM improved this session 0-115 degrees.  Pt does have some hyperextension in RT knee present.  Suggested pt reach out to MD to inquire on getting a hinge brace at this point vs the knee immobilizer.  Pt is porgressing well overall.      Personal Factors and Comorbidities  Profession    Examination-Activity Limitations  Bathing;Stairs;Sit;Stand;Transfers    Examination-Participation Restrictions  Driving;Yard Work;Cleaning;Community Activity;Laundry;Other   work   Stability/Clinical Decision Making  Stable/Uncomplicated    Rehab Potential  Excellent    PT Frequency  2x / week   1x/week for 4, 2x/week for 4   PT Duration  8 weeks    PT Treatment/Interventions  ADLs/Self Care Home Management;Aquatic Therapy;Cryotherapy;Electrical  Stimulation;Moist Heat;DME Instruction;Gait training;Stair training;Functional mobility training;Therapeutic activities;Therapeutic exercise;Balance training;Neuromuscular re-education;Patient/family education;Manual techniques;Scar mobilization;Passive range of motion;Taping    PT Next Visit Plan  Pt is  8 weeks post-op today ( 09/10/18). Follow [facility] orthopedic ACL Autograft protocol. Continue with manual to help loosen tissue and improve ROM.Marland Kitchen Continue to increase Lt quadricep strength.  Follow up on brace.      PT Home Exercise Plan  Eval: quad set, heel slide, supine knee flexion stretch with rope, hip extension, hamsring curl, hip abduction, heel raises; 08/20/18 - patella mobilization, hamstring isometric, prone knee hang    Consulted and Agree with Plan of Care  Patient       Patient will benefit from skilled therapeutic intervention in order to improve the following deficits and impairments:  Abnormal gait, Decreased activity tolerance, Decreased endurance, Decreased range of motion, Decreased strength, Increased edema, Difficulty walking, Decreased mobility, Decreased balance, Increased fascial restricitons  Visit Diagnosis: Stiffness of left knee, not elsewhere classified  Acute pain of left knee  Muscle weakness (generalized)     Problem List Patient Active Problem List   Diagnosis Date Noted  . CONTUSION OF FOREARM 03/17/2009   Teena Irani, PTA/CLT [phone]  Teena Irani 09/10/2018, 12:37 PM  [place] [address] [place], [place], [address] Phone: [phone]   Fax:  [phone]  Name: Nathaniel [name] MRN: [id number] Date of Birth: 1996-03-05

## 2018-09-12 ENCOUNTER — Ambulatory Visit (HOSPITAL_COMMUNITY): Payer: BLUE CROSS/BLUE SHIELD | Admitting: Physical Therapy

## 2018-09-12 ENCOUNTER — Other Ambulatory Visit: Payer: Self-pay

## 2018-09-12 DIAGNOSIS — M25662 Stiffness of left knee, not elsewhere classified: Secondary | ICD-10-CM

## 2018-09-12 DIAGNOSIS — M6281 Muscle weakness (generalized): Secondary | ICD-10-CM

## 2018-09-12 DIAGNOSIS — M25562 Pain in left knee: Secondary | ICD-10-CM

## 2018-09-12 NOTE — Therapy (Signed)
[place], [place], [address] Phone: [phone]   Fax:  [phone]  Physical Therapy Treatment  Patient Details  Name: Nathaniel [name] MRN: [id number] Date of Birth: 1996-01-05 Referring Provider (PT): Doren Custard Robb Matar, [place]   Encounter Date: 09/12/2018  PT End of Session - 09/12/18 1238    Visit Number  6    Number of Visits  12    Date for PT Re-Evaluation  10/08/18    Authorization Type  [employer] Other (no auth required, 60 visits per year PT/OT combined, $20 co-pay)    Authorization Time Period  08/13/18 - 10/08/18   mini-re-assess 09/10/18   Authorization - Visit Number  6    Authorization - Number of Visits  60    PT Start Time  1120    PT Stop Time  1211    PT Time Calculation (min)  51 min    Activity Tolerance  Patient tolerated treatment well;No increased pain    Behavior During Therapy  [facility] for tasks assessed/performed       No past medical history on file.  Past Surgical History:  Procedure Laterality Date  . ANTERIOR CRUCIATE LIGAMENT REPAIR Left 07/16/2018    There were no vitals filed for this visit.  Subjective Assessment - 09/12/18 1241    Subjective  PT reports it is moving better.  States his knee was a little sore after last session but no pain.  States he called MD but has not gotten return call.                       [facility] Adult PT Treatment/Exercise - 09/12/18 0001      Knee/Hip Exercises: Stretches   Active Hamstring Stretch  Left;3 reps;30 seconds    Active Hamstring Stretch Limitations  12" box standing    Knee: Self-Stretch to increase Flexion  Left;10 seconds    Knee: Self-Stretch Limitations  10 reps on 12" step    Gastroc Stretch  Both;3 reps;30 seconds      Knee/Hip Exercises: Aerobic   Stationary Bike  beginning of session: 5 minutes full revolutions on seat 15 for AAROM to Lt knee    Elliptical  following bike: 1 minute forward, 1 minute  backward      Knee/Hip Exercises: Machines for Strengthening   Cybex Leg Press  5 PL 10 reps 2 sets (bil LE)      Knee/Hip Exercises: Standing   Heel Raises  Both;15 reps    Knee Flexion  Left;2 sets;15 reps    Forward Lunges  Left;Limitations;15 reps    Forward Lunges Limitations  onto 4" step no UE's    Functional Squat  15 reps      Knee/Hip Exercises: Supine   Quad Sets  2 sets;15 reps    Short Arc Quad Sets  Limitations    Straight Leg Raises  Left;10 reps;Limitations    Straight Leg Raises Limitations  cues for QS prior to decrease extension lag (8 degree)    Knee Extension  AROM    Knee Extension Limitations  0    Knee Flexion  AROM    Knee Flexion Limitations  118      Modalities   Modalities  Electrical Stimulation      Electrical Stimulation   Electrical Stimulation Location  Lt Quadriceps    Electrical Stimulation Action  mm re-education    Electrical Stimulation Parameters  10 minutes, Russian Co-contraction,  50% duty cycle, 10/20 on/off, 27 mAcc    Electrical Stimulation Goals  Strength;Neuromuscular facilitation      Manual Therapy   Manual Therapy  --    Manual therapy comments  --    Joint Mobilization  --    Myofascial Release  --               PT Short Term Goals - 08/27/18 1401      PT SHORT TERM GOAL #1   Title  Patient will be independent with HEP, updated PRN, toprogress safely through rehab protocol.     Time  1    Period  Weeks    Status  On-going    Target Date  08/20/18      PT SHORT TERM GOAL #2   Title  Patient will achieve AROM 0-100 to progress safely to next phase of protocol for strengthening and mobility/ROM exercises.     Time  3    Period  Weeks    Status  Not Met    Target Date  09/03/18      PT SHORT TERM GOAL #3   Title  Patient will demonstrate normal gait pattern with no brace ro crutches to indicate good quad control and tolerance to FWB to improve mobility in daily activities and progress to next phase of  protocol.     Time  4    Period  Weeks    Status  Not Met    Target Date  09/10/18        PT Long Term Goals - 08/27/18 1401      PT LONG TERM GOAL #1   Title  Patient will achieve 0-130 degrees on Lt knee AROM to equal Rt Knee mobility to imrpove fucntional giat and mobility.     Time  6    Period  Weeks    Status  Not Met      PT LONG TERM GOAL #2   Title  Patient will demosntrate 70% hamstring strength on Lt knee compared to Rt knee to indicate imrpovements in functional strength with 1 rep max test.     Time  8    Period  Weeks    Status  Not Met      PT LONG TERM GOAL #3   Title  Patient will demosntrate 60% quadriceps strength on Lt knee compared to Rt knee to indicate imrpovements in functional strength with 1 rep max test.     Time  8    Period  Weeks    Status  Not Met            Plan - 09/12/18 1238    Clinical Impression Statement  pt continues to improve with ability to complete full revolutions on bike with seat closer and improving gait quality.  Less instability noted with quadricep completing wall sitting and increased to 5Pl today on leg press.  ROM increased to 118 flexion this session.      Personal Factors and Comorbidities  Profession    Examination-Activity Limitations  Bathing;Stairs;Sit;Stand;Transfers    Examination-Participation Restrictions  Driving;Yard Work;Cleaning;Community Activity;Laundry;Other   work   Stability/Clinical Decision Making  Stable/Uncomplicated    Rehab Potential  Excellent    PT Frequency  2x / week   1x/week for 4, 2x/week for 4   PT Duration  8 weeks    PT Treatment/Interventions  ADLs/Self Care Home Management;Aquatic Therapy;Cryotherapy;Electrical Stimulation;Moist Heat;DME Instruction;Gait training;Stair training;Functional mobility training;Therapeutic activities;Therapeutic exercise;Balance training;Neuromuscular re-education;Patient/family education;Manual  techniques;Scar mobilization;Passive range of  motion;Taping    PT Next Visit Plan  Pt is  8 weeks post-op  ( 09/10/18). Follow [facility] orthopedic ACL Autograft protocol. Continue with manual to help loosen tissue and improve ROM.Marland Kitchen Continue to increase Lt quadricep strength.  Follow up on brace.      PT Home Exercise Plan  Eval: quad set, heel slide, supine knee flexion stretch with rope, hip extension, hamsring curl, hip abduction, heel raises; 08/20/18 - patella mobilization, hamstring isometric, prone knee hang    Consulted and Agree with Plan of Care  Patient       Patient will benefit from skilled therapeutic intervention in order to improve the following deficits and impairments:  Abnormal gait, Decreased activity tolerance, Decreased endurance, Decreased range of motion, Decreased strength, Increased edema, Difficulty walking, Decreased mobility, Decreased balance, Increased fascial restricitons  Visit Diagnosis: Stiffness of left knee, not elsewhere classified  Acute pain of left knee  Muscle weakness (generalized)     Problem List Patient Active Problem List   Diagnosis Date Noted  . CONTUSION OF FOREARM 03/17/2009   Teena Irani, PTA/CLT [phone]  Teena Irani 09/12/2018, 12:43 PM  [place] [address] [place], [place], [address] Phone: [phone]   Fax:  [phone]  Name: Nathaniel [name] MRN: [id number] Date of Birth: 03-Oct-1995

## 2018-09-17 ENCOUNTER — Ambulatory Visit (HOSPITAL_COMMUNITY): Payer: BC Managed Care – PPO | Attending: Physician Assistant | Admitting: Physical Therapy

## 2018-09-17 ENCOUNTER — Telehealth (HOSPITAL_COMMUNITY): Payer: Self-pay | Admitting: Physical Therapy

## 2018-09-17 DIAGNOSIS — M6281 Muscle weakness (generalized): Secondary | ICD-10-CM | POA: Insufficient documentation

## 2018-09-17 DIAGNOSIS — M25562 Pain in left knee: Secondary | ICD-10-CM | POA: Insufficient documentation

## 2018-09-17 DIAGNOSIS — M25662 Stiffness of left knee, not elsewhere classified: Secondary | ICD-10-CM | POA: Insufficient documentation

## 2018-09-17 NOTE — Telephone Encounter (Signed)
Pt did not show for appointment.  Tried contact with given number several times, however no answer and no voicemail available.  Pt does not have any other contact information available.   Lurena Nida, PTA/CLT [phone]

## 2018-09-19 ENCOUNTER — Ambulatory Visit (HOSPITAL_COMMUNITY): Payer: BC Managed Care – PPO | Admitting: Physical Therapy

## 2018-09-19 ENCOUNTER — Other Ambulatory Visit: Payer: Self-pay

## 2018-09-19 ENCOUNTER — Ambulatory Visit (HOSPITAL_COMMUNITY): Payer: BC Managed Care – PPO

## 2018-09-19 DIAGNOSIS — M25662 Stiffness of left knee, not elsewhere classified: Secondary | ICD-10-CM | POA: Diagnosis present

## 2018-09-19 DIAGNOSIS — M6281 Muscle weakness (generalized): Secondary | ICD-10-CM | POA: Diagnosis present

## 2018-09-19 DIAGNOSIS — M25562 Pain in left knee: Secondary | ICD-10-CM | POA: Diagnosis present

## 2018-09-19 NOTE — Therapy (Signed)
[place], [place], [address] Phone: [phone]   Fax:  [phone]  Physical Therapy Treatment  Patient Details  Name: Nathaniel [name] MRN: [id number] Date of Birth: Aug 27, 1995 Referring Provider (PT): Doren Custard Robb Matar, [place]   Encounter Date: 09/19/2018  PT End of Session - 09/19/18 1214    Visit Number  7    Number of Visits  12    Date for PT Re-Evaluation  10/08/18    Authorization Type  [employer] Other (no auth required, 60 visits per year PT/OT combined, $20 co-pay)    Authorization Time Period  08/13/18 - 10/08/18   mini-re-assess 09/10/18   Authorization - Visit Number  7    Authorization - Number of Visits  60    PT Start Time  0945    PT Stop Time  1030    PT Time Calculation (min)  45 min    Activity Tolerance  Patient tolerated treatment well;No increased pain    Behavior During Therapy  [facility] for tasks assessed/performed       No past medical history on file.  Past Surgical History:  Procedure Laterality Date  . ANTERIOR CRUCIATE LIGAMENT REPAIR Left 07/16/2018    There were no vitals filed for this visit.  Subjective Assessment - 09/19/18 1156    Subjective  pt states he got his appt time wrong last visit.  STates he is doing well and has not used his KI for 1 week.  Can tell it's getting stronger.     Currently in Pain?  No/denies                       [facility] Adult PT Treatment/Exercise - 09/19/18 0001      Knee/Hip Exercises: Stretches   Active Hamstring Stretch  Left;3 reps;30 seconds    Active Hamstring Stretch Limitations  12" box standing    Knee: Self-Stretch to increase Flexion  Left;10 seconds    Knee: Self-Stretch Limitations  10 reps on 12" step      Knee/Hip Exercises: Aerobic   Stationary Bike  beginning of session: 5 minutes full revolutions on seat 15 for AAROM to Lt knee    Elliptical  following bike: 1 minute forward, 1 minute backward      Knee/Hip  Exercises: Machines for Strengthening   Cybex Leg Press  5 PL 15 reps 2 sets (bil LE)    Other Machine  biodex 90-40 degrees 165-135-90 10 reps each      Knee/Hip Exercises: Standing   Heel Raises  Both;15 reps    Heel Raises Limitations  Lt only with 1 HHA 10 reps    Knee Flexion  Left;2 sets;15 reps    Forward Lunges  Left;Limitations;15 reps    Forward Lunges Limitations  onto 4" step no UE's    Wall Squat  5 reps;Limitations    Wall Squat Limitations  30 sec holds      Knee/Hip Exercises: Supine   Quad Sets  2 sets;15 reps    Short Arc Quad Sets  Left;15 reps    Short Arc Quad Sets Limitations  5 sec holds    Straight Leg Raises  Left;10 reps;Limitations    Straight Leg Raises Limitations  cues for QS prior to decrease extension lag (8 degree)    Knee Extension  AROM    Knee Extension Limitations  0    Knee Flexion  AROM    Knee Flexion  Limitations  120      Manual Therapy   Manual Therapy  Myofascial release    Manual therapy comments  performed prior to ROM, seperate from other interventions    Myofascial Release  to reduce adhesions, myofascial restrictions, improve ROM             PT Education - 09/19/18 1157    Education Details  discussed beginning walking program (per protocol) and suggested to begin 1/2 mile this week and increase to 1 mile.         PT Short Term Goals - 08/27/18 1401      PT SHORT TERM GOAL #1   Title  Patient will be independent with HEP, updated PRN, toprogress safely through rehab protocol.     Time  1    Period  Weeks    Status  On-going    Target Date  08/20/18      PT SHORT TERM GOAL #2   Title  Patient will achieve AROM 0-100 to progress safely to next phase of protocol for strengthening and mobility/ROM exercises.     Time  3    Period  Weeks    Status  Not Met    Target Date  09/03/18      PT SHORT TERM GOAL #3   Title  Patient will demonstrate normal gait pattern with no brace ro crutches to indicate good quad control  and tolerance to FWB to improve mobility in daily activities and progress to next phase of protocol.     Time  4    Period  Weeks    Status  Not Met    Target Date  09/10/18        PT Long Term Goals - 08/27/18 1401      PT LONG TERM GOAL #1   Title  Patient will achieve 0-130 degrees on Lt knee AROM to equal Rt Knee mobility to imrpove fucntional giat and mobility.     Time  6    Period  Weeks    Status  Not Met      PT LONG TERM GOAL #2   Title  Patient will demosntrate 70% hamstring strength on Lt knee compared to Rt knee to indicate imrpovements in functional strength with 1 rep max test.     Time  8    Period  Weeks    Status  Not Met      PT LONG TERM GOAL #3   Title  Patient will demosntrate 60% quadriceps strength on Lt knee compared to Rt knee to indicate imrpovements in functional strength with 1 rep max test.     Time  8    Period  Weeks    Status  Not Met            Plan - 09/19/18 1216    Clinical Impression Statement  Pt now in post op week 9 S/P ACL reconstruction.  Pt able to complete single leg heelraise this session and began isokinetic work out at higher speeds and wihin 40-90 degrees ROM.  Pt able to push self to increase to to 30 second holds with wall sits.  Overall progressing well with only 2-3 degrees of extension lag when completing SLR.  No pain voiced during or following therex.    Personal Factors and Comorbidities  Profession    Examination-Activity Limitations  Bathing;Stairs;Sit;Stand;Transfers    Examination-Participation Restrictions  Driving;Yard Work;Cleaning;Community Activity;Laundry;Other   work   [employer]  Stable/Uncomplicated    Rehab Potential  Excellent    PT Frequency  2x / week   1x/week for 4, 2x/week for 4   PT Duration  8 weeks    PT Treatment/Interventions  ADLs/Self Care Home Management;Aquatic Therapy;Cryotherapy;Electrical Stimulation;Moist Heat;DME Instruction;Gait training;Stair  training;Functional mobility training;Therapeutic activities;Therapeutic exercise;Balance training;Neuromuscular re-education;Patient/family education;Manual techniques;Scar mobilization;Passive range of motion;Taping    PT Next Visit Plan  Pt is  9 weeks post-op  ( 09/17/18). Follow [facility]opedic ACL Autograft protocol. Continue with manual to help loosen tissue and improve ROM.Marland Kitchen Continue to increase Lt quadricep strength.  Next session discontinue bike, increase time on nustep.    PT Home Exercise Plan  Eval: quad set, heel slide, supine knee flexion stretch with rope, hip extension, hamsring curl, hip abduction, heel raises; 08/20/18 - patella mobilization, hamstring isometric, prone knee hang    Consulted and Agree with Plan of Care  Patient       Patient will benefit from skilled therapeutic intervention in order to improve the following deficits and impairments:  Abnormal gait, Decreased activity tolerance, Decreased endurance, Decreased range of motion, Decreased strength, Increased edema, Difficulty walking, Decreased mobility, Decreased balance, Increased fascial restricitons  Visit Diagnosis: No diagnosis found.     Problem List Patient Active Problem List   Diagnosis Date Noted  . CONTUSION OF FOREARM 03/17/2009   Teena Irani, PTA/CLT [phone]  Teena Irani 09/19/2018, 12:19 PM  [place] [address] [place], [place], [address] Phone: [phone]   Fax:  [phone]  Name: KAMARIAN SAHAKIAN MRN: [id number] Date of Birth: 11-22-1995

## 2018-09-24 ENCOUNTER — Ambulatory Visit (HOSPITAL_COMMUNITY): Payer: BC Managed Care – PPO | Admitting: Physical Therapy

## 2018-09-24 ENCOUNTER — Encounter (HOSPITAL_COMMUNITY): Payer: Self-pay | Admitting: Physical Therapy

## 2018-09-24 ENCOUNTER — Other Ambulatory Visit: Payer: Self-pay

## 2018-09-24 DIAGNOSIS — M25662 Stiffness of left knee, not elsewhere classified: Secondary | ICD-10-CM | POA: Diagnosis not present

## 2018-09-24 DIAGNOSIS — M25562 Pain in left knee: Secondary | ICD-10-CM

## 2018-09-24 DIAGNOSIS — M6281 Muscle weakness (generalized): Secondary | ICD-10-CM

## 2018-09-24 NOTE — Therapy (Signed)
[place], [place], [address] Phone: [phone]   Fax:  [phone]  Physical Therapy Treatment  Patient Details  Name: Nathaniel [name] MRN: [id number] Date of Birth: 06-15-1995 Referring Provider (PT): Doren Custard Robb Matar, [place]   Encounter Date: 09/24/2018  PT End of Session - 09/24/18 1254    Visit Number  9    Number of Visits  12    Date for PT Re-Evaluation  10/08/18    Authorization Type  [employer] Other (no auth required, 60 visits per year PT/OT combined, $20 co-pay)    Authorization Time Period  08/13/18 - 10/08/18   mini-re-assess 09/10/18   Authorization - Visit Number  9    Authorization - Number of Visits  60    PT Start Time  0623    PT Stop Time  7628    PT Time Calculation (min)  44 min    Activity Tolerance  Patient tolerated treatment well;No increased pain    Behavior During Therapy  [facility] for tasks assessed/performed       History reviewed. No pertinent past medical history.  Past Surgical History:  Procedure Laterality Date  . ANTERIOR CRUCIATE LIGAMENT REPAIR Left 07/16/2018    There were no vitals filed for this visit.  Subjective Assessment - 09/24/18 1300    Subjective  pt states he is walking 2-2.5 miles a day.  Reports no issues or pain during ambulation.      Currently in Pain?  No/denies                       [facility] Adult PT Treatment/Exercise - 09/24/18 0001      Knee/Hip Exercises: Stretches   Active Hamstring Stretch  Left;3 reps;30 seconds    Active Hamstring Stretch Limitations  12" box standing    Knee: Self-Stretch to increase Flexion  Left;10 seconds    Knee: Self-Stretch Limitations  10 reps on 12" step      Knee/Hip Exercises: Aerobic   Elliptical  3 minutes forward, 3 minutes backward      Knee/Hip Exercises: Machines for Strengthening   Cybex Leg Press  6 PL 15 reps 2 sets (bil LE)      Knee/Hip Exercises: Standing   Heel Raises  Left;15  reps;2 sets    Lateral Step Up  Left;10 reps;Hand Hold: 0;Step Height: 4"    Forward Step Up  Left;10 reps;Step Height: 4";Hand Hold: 0    Step Down  Left;10 reps;Step Height: 4";Hand Hold: 0    Functional Squat  15 reps    Wall Squat  5 reps;Limitations    Wall Squat Limitations  30 sec holds      Knee/Hip Exercises: Supine   Short Arc Quad Sets  Left;15 reps    Short Arc Quad Sets Limitations  5 sec holds     Straight Leg Raises  Left;10 reps;Limitations;2 sets    Straight Leg Raises Limitations  cues for QS prior to decrease extension lag (8 degree)    Knee Extension  AROM    Knee Extension Limitations  0    Knee Flexion  AROM    Knee Flexion Limitations  120               PT Short Term Goals - 08/27/18 1401      PT SHORT TERM GOAL #1   Title  Patient will be independent with HEP, updated PRN, toprogress safely through rehab protocol.  Time  1    Period  Weeks    Status  On-going    Target Date  08/20/18      PT SHORT TERM GOAL #2   Title  Patient will achieve AROM 0-100 to progress safely to next phase of protocol for strengthening and mobility/ROM exercises.     Time  3    Period  Weeks    Status  Not Met    Target Date  09/03/18      PT SHORT TERM GOAL #3   Title  Patient will demonstrate normal gait pattern with no brace ro crutches to indicate good quad control and tolerance to FWB to improve mobility in daily activities and progress to next phase of protocol.     Time  4    Period  Weeks    Status  Not Met    Target Date  09/10/18        PT Long Term Goals - 08/27/18 1401      PT LONG TERM GOAL #1   Title  Patient will achieve 0-130 degrees on Lt knee AROM to equal Rt Knee mobility to imrpove fucntional giat and mobility.     Time  6    Period  Weeks    Status  Not Met      PT LONG TERM GOAL #2   Title  Patient will demosntrate 70% hamstring strength on Lt knee compared to Rt knee to indicate imrpovements in functional strength with 1 rep max  test.     Time  8    Period  Weeks    Status  Not Met      PT LONG TERM GOAL #3   Title  Patient will demosntrate 60% quadriceps strength on Lt knee compared to Rt knee to indicate imrpovements in functional strength with 1 rep max test.     Time  8    Period  Weeks    Status  Not Met            Plan - 09/24/18 1257    Clinical Impression Statement  pt running a little late today.  Able to continue per ACL protocol, s/p 10 weeks.  Noted improvement in giat with reduced antalgia and increasing LE strength.  ROM up to 125 today.  Increased weights on leg press and began step ups.     Personal Factors and Comorbidities  Profession    Examination-Activity Limitations  Bathing;Stairs;Sit;Stand;Transfers    Examination-Participation Restrictions  Driving;Yard Work;Cleaning;Community Activity;Laundry;Other   work   Stability/Clinical Decision Making  Stable/Uncomplicated    Rehab Potential  Excellent    PT Frequency  2x / week   1x/week for 4, 2x/week for 4   PT Duration  8 weeks    PT Treatment/Interventions  ADLs/Self Care Home Management;Aquatic Therapy;Cryotherapy;Electrical Stimulation;Moist Heat;DME Instruction;Gait training;Stair training;Functional mobility training;Therapeutic activities;Therapeutic exercise;Balance training;Neuromuscular re-education;Patient/family education;Manual techniques;Scar mobilization;Passive range of motion;Taping    PT Next Visit Plan  Pt is  10 weeks post-op  ( 09/24/18). Follow [facility] orthopedic ACL Autograft protocol. Continue with manual to help loosen tissue and improve ROM.Marland Kitchen Continue to increase Lt quadricep strength.     PT Home Exercise Plan  Eval: quad set, heel slide, supine knee flexion stretch with rope, hip extension, hamsring curl, hip abduction, heel raises; 08/20/18 - patella mobilization, hamstring isometric, prone knee hang    Consulted and Agree with Plan of Care  Patient       Patient will benefit from skilled  therapeutic  intervention in order to improve the following deficits and impairments:  Abnormal gait, Decreased activity tolerance, Decreased endurance, Decreased range of motion, Decreased strength, Increased edema, Difficulty walking, Decreased mobility, Decreased balance, Increased fascial restricitons  Visit Diagnosis: Stiffness of left knee, not elsewhere classified  Acute pain of left knee  Muscle weakness (generalized)     Problem List Patient Active Problem List   Diagnosis Date Noted  . CONTUSION OF FOREARM 03/17/2009   Teena Irani, PTA/CLT [phone]  Teena Irani 09/24/2018, 1:02 PM  [place], [place], [address] Phone: [phone]   Fax:  [phone]  Name: Nathaniel [name] MRN: [id number] Date of Birth: 13-Jul-1995

## 2018-09-26 ENCOUNTER — Ambulatory Visit (HOSPITAL_COMMUNITY): Payer: BC Managed Care – PPO | Admitting: Physical Therapy

## 2018-09-26 ENCOUNTER — Other Ambulatory Visit: Payer: Self-pay

## 2018-09-26 ENCOUNTER — Encounter (HOSPITAL_COMMUNITY): Payer: Self-pay | Admitting: Physical Therapy

## 2018-09-26 DIAGNOSIS — M25662 Stiffness of left knee, not elsewhere classified: Secondary | ICD-10-CM | POA: Diagnosis not present

## 2018-09-26 DIAGNOSIS — M25562 Pain in left knee: Secondary | ICD-10-CM

## 2018-09-26 DIAGNOSIS — M6281 Muscle weakness (generalized): Secondary | ICD-10-CM

## 2018-09-26 NOTE — Therapy (Addendum)
[place], [place], [address] Phone: [phone]   Fax:  [phone]  Physical Therapy Treatment Progress Note Reporting Period 08/13/2018 to 09/26/2018  See note below for Objective Data and Assessment of Progress/Goals.   I agree with the re-assessment findings of objective measures taken by Roseanne Reno, PTA at this visit. Nathaniel [name] is progressing well and is 10 weeks s/p Lt ACL repair. He now has functional ROM but is lacking in Lt knee flexion compared to Rt LE. He has initiated a walking program and is walking 1-2 miles a day when the weather permits. He is eager to return to work. His job is a physically demanding job and he will benefit from continued skilled PT interventions to address impairments and improve strength to maintain integrity of repair.  Kipp Brood, PT, DPT, [facility] Physical Therapist with [facility]    Patient Details  Name: Nathaniel [name] MRN: [id number] Date of Birth: 1996/01/09 Referring Provider (PT): Doren Custard Robb Matar, [place]   Encounter Date: 09/26/2018  PT End of Session - 09/26/18 1535    Visit Number  10   10th visit PN completed #10   Number of Visits  12    Date for PT Re-Evaluation  10/08/18    Authorization Type  [employer] Other (no auth required, 60 visits per year PT/OT combined, $20 co-pay)    Authorization Time Period  08/13/18 - 10/08/18   mini-re-assess 09/10/18   Authorization - Visit Number  10    Authorization - Number of Visits  60    PT Start Time  0945    PT Stop Time  3299    PT Time Calculation (min)  43 min    Activity Tolerance  Patient tolerated treatment well;No increased pain    Behavior During Therapy  [facility] for tasks assessed/performed       History reviewed. No pertinent past medical history.  Past Surgical History:  Procedure Laterality Date  . ANTERIOR CRUCIATE LIGAMENT REPAIR Left 07/16/2018    There were no vitals  filed for this visit.  Subjective Assessment - 09/26/18 1521    Subjective  pt states he continues to walk and is eager to gett better and back to work.     Currently in Pain?  No/denies        [facility] PT Assessment - 09/26/18 0001      Assessment   Medical Diagnosis  Lt ACL Reconstruction with Hamstring Autograft    Referring Provider (PT)  Cline Crock, PA-C    Onset Date/Surgical Date  07/16/18    Next MD Visit  10/09/2018      Precautions   Precautions  Other (comment);Knee    Precaution Comments  [facility] ACL Autograft Protocol      Circumferential Edema   Circumferential - Right  40 cm at joint line    Circumferential - Left   40 cm at joint line   was 41.5cm     AROM   Right Knee Extension  0    Right Knee Flexion  135    Left Knee Extension  0   was lacking 10   Left Knee Flexion  125   was 85     Strength   Right Hip Flexion  5/5    Right Hip Extension  5/5    Right Hip ABduction  5/5    Left Hip Extension  5/5   was 4/5  Left Hip ABduction  5/5   wa 4/5   Right/Left Knee  Right;Left    Right Knee Flexion  5/5    Right Knee Extension  5/5    Left Knee Flexion  4-/5   was 2+/5   Right Ankle Dorsiflexion  5/5    Left Ankle Dorsiflexion  5/5   was 4/5        [facility] Adult PT Treatment/Exercise - 09/26/18 0001      Knee/Hip Exercises: Stretches   Active Hamstring Stretch  Left;3 reps;30 seconds    Active Hamstring Stretch Limitations  12" box standing    Knee: Self-Stretch to increase Flexion  Left;10 seconds    Knee: Self-Stretch Limitations  10 reps on 12" step      Knee/Hip Exercises: Aerobic   Elliptical  3 minutes forward, 3 minutes backward      Knee/Hip Exercises: Machines for Strengthening   Cybex Leg Press  6 PL 15 reps 2 sets (bil LE)    Other Machine  biodex 90-40 degrees 150-120-90 10 reps, 2 sets each      Knee/Hip Exercises: Standing   Heel Raises  Left;15 reps;2 sets    Heel Raises Limitations  Lt only with 1  HHA 10 reps    Forward Lunges  Left;Limitations;15 reps    Forward Lunges Limitations  onto 4" step no UE's    Lateral Step Up  Left;Hand Hold: 0;Step Height: 4";15 reps    Forward Step Up  Left;Hand Hold: 0;15 reps;Step Height: 6"    Step Down  Left;Step Height: 4";Hand Hold: 0;15 reps    Functional Squat  15 reps    Wall Squat  5 reps;Limitations    Wall Squat Limitations  30 sec holds    Other Standing Knee Exercises  sports cord walking fwd, bkwd, laterals 2RT each    Other Standing Knee Exercises  fitter slide 20 reps      Knee/Hip Exercises: Supine   Knee Extension  AROM    Knee Extension Limitations  0    Knee Flexion  AROM    Knee Flexion Limitations  125        PT Short Term Goals - 09/26/18 1537      PT SHORT TERM GOAL #1   Title  Patient will be independent with HEP, updated PRN, toprogress safely through rehab protocol.     Time  1    Period  Weeks    Status  Achieved    Target Date  08/20/18      PT SHORT TERM GOAL #2   Title  Patient will achieve AROM 0-100 to progress safely to next phase of protocol for strengthening and mobility/ROM exercises.     Time  3    Period  Weeks    Status  Achieved    Target Date  09/03/18      PT SHORT TERM GOAL #3   Title  Patient will demonstrate normal gait pattern with no brace ro crutches to indicate good quad control and tolerance to FWB to improve mobility in daily activities and progress to next phase of protocol.     Time  4    Period  Weeks    Status  Achieved    Target Date  09/10/18        PT Long Term Goals - 09/26/18 1537      PT LONG TERM GOAL #1   Title  Patient will achieve 0-130 degrees on Lt knee AROM to  equal Rt Knee mobility to imrpove fucntional giat and mobility.     Time  6    Period  Weeks    Status  On-going      PT LONG TERM GOAL #2   Title  Patient will demosntrate 70% hamstring strength on Lt knee compared to Rt knee to indicate imrpovements in functional strength with 1 rep max test.      Time  8    Period  Weeks    Status  On-going      PT LONG TERM GOAL #3   Title  Patient will demosntrate 60% quadriceps strength on Lt knee compared to Rt knee to indicate imrpovements in functional strength with 1 rep max test.     Time  8    Period  Weeks    Status  On-going        Plan - 09/26/18 1538    Clinical Impression Statement  Pt is overall progressing well per 10th week of ACL protocol.  PT has met STG's and moving into LTG's. Pt is walking 2-2.5 miles each day on level surfaces.    Current ROM of Lt knee is 0-125 degrees (Rt is 0-135). Pt no longer with antalgic gait and able to complete 30" static wall sits, evenly with low quad fatigue.  Pt is eager to return to work and back to normal functioning.       Personal Factors and Comorbidities  Profession    Examination-Activity Limitations  Bathing;Stairs;Sit;Stand;Transfers    Examination-Participation Restrictions  Driving;Yard Work;Cleaning;Community Activity;Laundry;Other   work   Stability/Clinical Decision Making  Stable/Uncomplicated    Rehab Potential  Excellent    PT Frequency  2x / week   1x/week for 4, 2x/week for 4   PT Duration  4 weeks    PT Treatment/Interventions  ADLs/Self Care Home Management;Aquatic Therapy;Cryotherapy;Electrical Stimulation;Moist Heat;DME Instruction;Gait training;Stair training;Functional mobility training;Therapeutic activities;Therapeutic exercise;Balance training;Neuromuscular re-education;Patient/family education;Manual techniques;Scar mobilization;Passive range of motion;Taping    PT Next Visit Plan  Pt is  10 weeks post-op  ( 09/24/18). Follow [facility] orthopedic ACL Autograft protocol. Continue with manual PRN and continue to check on ROM.  Next session complete 1RM and begin retro ambulation.  F/U on walking program.     PT Home Exercise Plan  Eval: quad set, heel slide, supine knee flexion stretch with rope, hip extension, hamsring curl, hip abduction, heel raises; 08/20/18 -  patella mobilization, hamstring isometric, prone knee hang    Consulted and Agree with Plan of Care  Patient       Patient will benefit from skilled therapeutic intervention in order to improve the following deficits and impairments:  Abnormal gait, Decreased activity tolerance, Decreased endurance, Decreased range of motion, Decreased strength, Increased edema, Difficulty walking, Decreased mobility, Decreased balance, Increased fascial restricitons  Visit Diagnosis: Stiffness of left knee, not elsewhere classified  Acute pain of left knee  Muscle weakness (generalized)     Problem List Patient Active Problem List   Diagnosis Date Noted  . CONTUSION OF FOREARM 03/17/2009   Teena Irani, PTA/CLT [phone]  Teena Irani 09/26/2018, 3:46 PM  [place] [address] [place], [place], [address] Phone: [phone]   Fax:  [phone]  Name: Nathaniel [name] MRN: [id number] Date of Birth: 06/29/1995

## 2018-10-01 ENCOUNTER — Encounter (HOSPITAL_COMMUNITY): Payer: Self-pay

## 2018-10-01 ENCOUNTER — Ambulatory Visit (HOSPITAL_COMMUNITY): Payer: BC Managed Care – PPO

## 2018-10-01 ENCOUNTER — Other Ambulatory Visit: Payer: Self-pay

## 2018-10-01 DIAGNOSIS — M25662 Stiffness of left knee, not elsewhere classified: Secondary | ICD-10-CM | POA: Diagnosis not present

## 2018-10-01 DIAGNOSIS — M25562 Pain in left knee: Secondary | ICD-10-CM

## 2018-10-01 DIAGNOSIS — M6281 Muscle weakness (generalized): Secondary | ICD-10-CM

## 2018-10-01 NOTE — Patient Instructions (Signed)
Single Leg Squat with Chair Touch reps: 10 sets: 1-2 daily: 1 weekly: 7   Exercise image step 1   Exercise image step 2  Setup  Begin in a standing upright position in front of a chair. Movement  Lift one leg off of the ground and lower yourself into a squatting position, bending at your hips and knees until you lightly touch the chair. Return to a standing position and repeat. Tip  Make sure to maintain your balance during the exercise and do not let your knee bend forward past your toes. Prone Quadriceps Stretch with Strap reps: 3 daily: 1 weekly: 7   Exercise image step 1   Exercise image step 2  Setup  Begin lying on your front with your legs straight, holding the end of a strap that is looped around one foot.  Movement  Pull the end of the strap over your shoulder on the same side of your body, bending your knee, until you feel a gentle stretch in your thigh. Hold stretch for 30 seconds. Kick your leg against the rope for 5 seconds. Then pull the rope slightly tighter to increase the stretch. Hold for 30 seconds.  Tip  Do not let your low back arch during the stretch.

## 2018-10-01 NOTE — Therapy (Signed)
[facility] [address] [place], [place], [address] Phone: [phone]   Fax:  [phone]  Physical Therapy Treatment  Patient Details  Name: Nathaniel ReeksJonathan R Medlock MRN: [id number] Date of Birth: December 03, 1995 Referring Provider (PT): Durwin Noraixon, Katharina Caperhomas [place], [place]   Encounter Date: 10/01/2018  PT End of Session - 10/01/18 1011    Visit Number  11   10th visit PN completed #10   Number of Visits  20    Date for PT Re-Evaluation  10/08/18    Authorization Type  [employer] Other (no auth required, 60 visits per year PT/OT combined, $20 co-pay)    Authorization Time Period  08/13/18 - 10/08/18; 09/26/18-10/26/18   mini-re-assess 09/10/18   Authorization - Visit Number  11    Authorization - Number of Visits  60    PT Start Time  0950    PT Stop Time  1030    PT Time Calculation (min)  40 min    Activity Tolerance  Patient tolerated treatment well;No increased pain    Behavior During Therapy  [facility] for tasks assessed/performed       History reviewed. No pertinent past medical history.  Past Surgical History:  Procedure Laterality Date  . ANTERIOR CRUCIATE LIGAMENT REPAIR Left 07/16/2018    There were no vitals filed for this visit.  Subjective Assessment - 10/01/18 0954    Subjective  Patient reports he is doing well and has his appointment next tuesday to follow up with his MD. He is hoping to be able to return to work soon.     Pertinent History  LT ACL Reconstruction hamstring autograft 07/16/18    Patient Stated Goals  to get back to work and the gym    Currently in Pain?  No/denies        [facility] Adult PT Treatment/Exercise - 10/01/18 0001      Knee/Hip Exercises: Aerobic   Elliptical  3 minutes forward, 3 minutes backward; level 7       Knee/Hip Exercises: Machines for Strengthening   Total Gym Leg Press  BodyCraft Leg Press: Lt LE only, 40lbs, 2x 15 reps      Knee/Hip Exercises: Standing   Side Lunges  Both;2 sets;15 reps;Limitations    Side Lunges Limitations  towel slide    Forward Step Up  Left;2 sets;15 reps;Hand Hold: 0    Forward Step Up Limitations  12" box step up with SLS at top    Functional Squat  2 sets;Other (comment)   12 reps   Functional Squat Limitations  bulgarian squats: 12" box, 10lbs bil UE        PT Education - 10/01/18 1027    Education Details  Updated HEP and discussed plan to test 1 rep max next week.     Person(s) Educated  Patient    Methods  Explanation;Demonstration;Handout    Comprehension  Verbalized understanding;Returned demonstration       PT Short Term Goals - 09/26/18 1537      PT SHORT TERM GOAL #1   Title  Patient will be independent with HEP, updated PRN, toprogress safely through rehab protocol.     Time  1    Period  Weeks    Status  Achieved    Target Date  08/20/18      PT SHORT TERM GOAL #2   Title  Patient will achieve AROM 0-100 to progress safely to next phase of protocol for strengthening and mobility/ROM exercises.  Time  3    Period  Weeks    Status  Achieved    Target Date  09/03/18      PT SHORT TERM GOAL #3   Title  Patient will demonstrate normal gait pattern with no brace ro crutches to indicate good quad control and tolerance to FWB to improve mobility in daily activities and progress to next phase of protocol.     Time  4    Period  Weeks    Status  Achieved    Target Date  09/10/18        PT Long Term Goals - 09/26/18 1537      PT LONG TERM GOAL #1   Title  Patient will achieve 0-130 degrees on Lt knee AROM to equal Rt Knee mobility to imrpove fucntional giat and mobility.     Time  6    Period  Weeks    Status  On-going      PT LONG TERM GOAL #2   Title  Patient will demosntrate 70% hamstring strength on Lt knee compared to Rt knee to indicate imrpovements in functional strength with 1 rep max test.     Time  8    Period  Weeks    Status  On-going      PT LONG TERM GOAL #3   Title  Patient will demosntrate 60% quadriceps  strength on Lt knee compared to Rt knee to indicate imrpovements in functional strength with 1 rep max test.     Time  8    Period  Weeks    Status  On-going       Plan - 10/01/18 1019    Clinical Impression Statement  Patient continues to progress strengthening in therapy to target Lt quad strengthening in functional activities. He initiated single leg exercises with leg press, Bulgarian squats, lunges, and single leg chair squats. He had increased difficulty with Lt LE single limb balance and eccentric quad strength compared to Rt. He reports pain increase during session to ~ 5/10 and was instructed that if pain reach a 6/10 with any exercises to discontinue the exercise. HEP was updated with contract relax stretch for Lt quad and single limb squats to chair for strengthening. He will continue to benefit from skilled PT interventions to address impairments and to progress strength to safely return to work.     Personal Factors and Comorbidities  Profession    Examination-Activity Limitations  Bathing;Stairs;Sit;Stand;Transfers    Examination-Participation Restrictions  Driving;Yard Work;Cleaning;Community Activity;Laundry;Other   work   Stability/Clinical Decision Making  Stable/Uncomplicated    Rehab Potential  Excellent    PT Frequency  2x / week   1x/week for 4, 2x/week for 4   PT Duration  8 weeks    PT Treatment/Interventions  ADLs/Self Care Home Management;Aquatic Therapy;Cryotherapy;Electrical Stimulation;Moist Heat;DME Instruction;Gait training;Stair training;Functional mobility training;Therapeutic activities;Therapeutic exercise;Balance training;Neuromuscular re-education;Patient/family education;Manual techniques;Scar mobilization;Passive range of motion;Taping    PT Next Visit Plan  Pt is  11 weeks post-op  ( 10/01/18). Follow [facility] orthopedic ACL Autograft protocol. Continue with manual PRN and continue to check on ROM.  At 12 weeks complete 1RM and begin retro ambulation.  F/U  on walking program.     PT Home Exercise Plan  Eval: quad set, heel slide, supine knee flexion stretch with rope, hip extension, hamsring curl, hip abduction, heel raises; 08/20/18 - patella mobilization, hamstring isometric, prone knee hang    Consulted and Agree with Plan of Care  Patient  Patient will benefit from skilled therapeutic intervention in order to improve the following deficits and impairments:  Abnormal gait, Decreased activity tolerance, Decreased endurance, Decreased range of motion, Decreased strength, Increased edema, Difficulty walking, Decreased mobility, Decreased balance, Increased fascial restricitons  Visit Diagnosis: Stiffness of left knee, not elsewhere classified  Acute pain of left knee  Muscle weakness (generalized)     Problem List Patient Active Problem List   Diagnosis Date Noted  . CONTUSION OF FOREARM 03/17/2009    Valentino Saxon, PT, DPT, [facility] Physical Therapist with [facility]  10/01/2018 10:28 AM    [facility] [address] [place], [place], [address] Phone: [phone]   Fax:  [phone]  Name: HUGHES KRAPP MRN: [id number] Date of Birth: 1995/06/25

## 2018-10-01 NOTE — Addendum Note (Signed)
Addended by: Valentino Saxon E on: 10/01/2018 11:16 AM   Modules accepted: Orders

## 2018-10-03 ENCOUNTER — Ambulatory Visit (HOSPITAL_COMMUNITY): Payer: BC Managed Care – PPO | Admitting: Physical Therapy

## 2018-10-03 ENCOUNTER — Other Ambulatory Visit: Payer: Self-pay

## 2018-10-03 DIAGNOSIS — M6281 Muscle weakness (generalized): Secondary | ICD-10-CM

## 2018-10-03 DIAGNOSIS — M25562 Pain in left knee: Secondary | ICD-10-CM

## 2018-10-03 DIAGNOSIS — M25662 Stiffness of left knee, not elsewhere classified: Secondary | ICD-10-CM

## 2018-10-03 NOTE — Therapy (Signed)
[facility] [address] [place], [place], [address] Phone: [phone]   Fax:  [phone]  Physical Therapy Treatment  Patient Details  Name: Nathaniel [name] MRN: [id number] Date of Birth: [birth year] Referring Provider (PT): Durwin Nora Katharina Caper, [place]   Encounter Date: 10/03/2018  PT End of Session - 10/03/18 1116    Visit Number  12   10th visit PN completed #10   Number of Visits  20    Date for PT Re-Evaluation  10/08/18    Authorization Type  BCBS Other (no auth required, 60 visits per year PT/OT combined, $20 co-pay)    Authorization Time Period  08/13/18 - 10/08/18; 09/26/18-10/26/18   mini-re-assess 09/10/18   Authorization - Visit Number  12    Authorization - Number of Visits  60    PT Start Time  0950    PT Stop Time  1035    PT Time Calculation (min)  45 min    Activity Tolerance  Patient tolerated treatment well;No increased pain    Behavior During Therapy  [facility] for tasks assessed/performed       No past medical history on file.  Past Surgical History:  Procedure Laterality Date  . ANTERIOR CRUCIATE LIGAMENT REPAIR Left 07/16/2018    There were no vitals filed for this visit.  Subjective Assessment - 10/03/18 1113    Subjective  Pt states his glutes are still sore from Monday.  States he has some discomfort (pointing to patellar tendon) during eccentric phase of squat and with extension on isokinetic machine, otherwise no issues.  States he continues to ice at home.    Currently in Pain?  No/denies                       [facility] Adult PT Treatment/Exercise - 10/03/18 1012      Knee/Hip Exercises: Aerobic   Elliptical  3 minutes forward, 3 minutes backward; level 7       Knee/Hip Exercises: Machines for Strengthening   Total Gym Leg Press  BodyCraft Leg Press: Lt LE only, 40lbs, 2x 15 reps    Other Machine  biodex 90-40 degrees 150-120-90 10 reps, 2 sets each      Knee/Hip Exercises: Standing   Heel Raises  Left;15 reps;2 sets    Heel Raises Limitations  Lt only with 1 HHA 10 reps    Side Lunges  Both;2 sets;15 reps;Limitations    Side Lunges Limitations  towel slide    Lateral Step Up  Left;Hand Hold: 0;15 reps;Step Height: 6"    Lateral Step Up Limitations  working on eccentric phase, keeping hips level and controlled.    Forward Step Up  Left;2 sets;15 reps;Hand Hold: 0    Forward Step Up Limitations  12" box step up with SLS at top    Functional Squat  2 sets;Other (comment)   12 reps   Functional Squat Limitations  bulgarian squats: 12" box, 10lbs bil UE               PT Short Term Goals - 09/26/18 1537      PT SHORT TERM GOAL #1   Title  Patient will be independent with HEP, updated PRN, toprogress safely through rehab protocol.     Time  1    Period  Weeks    Status  Achieved    Target Date  08/20/18      PT SHORT TERM GOAL #2   Title  Patient will achieve AROM 0-100 to progress safely to next phase of protocol for strengthening and mobility/ROM exercises.     Time  3    Period  Weeks    Status  Achieved    Target Date  09/03/18      PT SHORT TERM GOAL #3   Title  Patient will demonstrate normal gait pattern with no brace ro crutches to indicate good quad control and tolerance to FWB to improve mobility in daily activities and progress to next phase of protocol.     Time  4    Period  Weeks    Status  Achieved    Target Date  09/10/18        PT Long Term Goals - 09/26/18 1537      PT LONG TERM GOAL #1   Title  Patient will achieve 0-130 degrees on Lt knee AROM to equal Rt Knee mobility to imrpove fucntional giat and mobility.     Time  6    Period  Weeks    Status  On-going      PT LONG TERM GOAL #2   Title  Patient will demosntrate 70% hamstring strength on Lt knee compared to Rt knee to indicate imrpovements in functional strength with 1 rep max test.     Time  8    Period  Weeks    Status  On-going      PT LONG TERM GOAL #3   Title   Patient will demosntrate 60% quadriceps strength on Lt knee compared to Rt knee to indicate imrpovements in functional strength with 1 rep max test.     Time  8    Period  Weeks    Status  On-going            Plan - 10/03/18 1405    Clinical Impression Statement  pt making gains weekly with less fatigue noted in Lt quad and functional actiities.  Pt does continue to require cues to complete squats in correct form, placing weight onto heels rather than pushing up onto toes.  Returns to MD next Tuesday so will need a Progress note completed next session.     Personal Factors and Comorbidities  Profession    Examination-Activity Limitations  Bathing;Stairs;Sit;Stand;Transfers    Examination-Participation Restrictions  Driving;Yard Work;Cleaning;Community Activity;Laundry;Other   work   Stability/Clinical Decision Making  Stable/Uncomplicated    Rehab Potential  Excellent    PT Frequency  2x / week   1x/week for 4, 2x/week for 4   PT Duration  8 weeks    PT Treatment/Interventions  ADLs/Self Care Home Management;Aquatic Therapy;Cryotherapy;Electrical Stimulation;Moist Heat;DME Instruction;Gait training;Stair training;Functional mobility training;Therapeutic activities;Therapeutic exercise;Balance training;Neuromuscular re-education;Patient/family education;Manual techniques;Scar mobilization;Passive range of motion;Taping    PT Next Visit Plan  complete 1 RM and progress note next session, pt will be 12  weeks post-op ( 10/08/18). [facility].  F/U on walking program.     PT Home Exercise Plan  Eval: quad set, heel slide, supine knee flexion stretch with rope, hip extension, hamsring curl, hip abduction, heel raises; 08/20/18 - patella mobilization, hamstring isometric, prone knee hang    Consulted and Agree with Plan of Care  Patient       Patient will benefit from skilled therapeutic intervention in order to improve the following deficits and  impairments:  Abnormal gait, Decreased activity tolerance, Decreased endurance, Decreased range of motion, Decreased strength, Increased edema, Difficulty walking, Decreased mobility, Decreased balance, Increased fascial  restricitons  Visit Diagnosis: Stiffness of left knee, not elsewhere classified  Acute pain of left knee  Muscle weakness (generalized)     Problem List Patient Active Problem List   Diagnosis Date Noted  . CONTUSION OF FOREARM 03/17/2009   Lurena Nida, PTA/CLT [phone]  Lurena Nida 10/03/2018, 2:19 PM  [facility] [address] [place], [place], [address] Phone: [phone]   Fax:  [phone]  Name: Nathaniel [name] MRN: [id number] Date of Birth: 1995/06/03

## 2018-10-10 ENCOUNTER — Telehealth (HOSPITAL_COMMUNITY): Payer: Self-pay

## 2018-10-10 ENCOUNTER — Ambulatory Visit (HOSPITAL_COMMUNITY): Payer: BC Managed Care – PPO

## 2018-10-10 NOTE — Telephone Encounter (Signed)
I called Pt at mobile number to inquire about missed appointment this morning. I left a message on his mobile number that we were checking in as he did not arrive for his appointment. I reminded him of his next appointment for Friday 10/12/18 at 9:45 AM and asked that he call our office if he is unable to make the appointment.   Valentino Saxon, PT, DPT, [facility] Physical Therapist with [facility]  10/10/2018 9:24 AM

## 2018-10-12 ENCOUNTER — Ambulatory Visit (HOSPITAL_COMMUNITY): Payer: BC Managed Care – PPO | Admitting: Physical Therapy

## 2018-10-12 ENCOUNTER — Encounter (HOSPITAL_COMMUNITY): Payer: Self-pay | Admitting: Physical Therapy

## 2018-10-12 ENCOUNTER — Other Ambulatory Visit: Payer: Self-pay

## 2018-10-12 DIAGNOSIS — M25662 Stiffness of left knee, not elsewhere classified: Secondary | ICD-10-CM

## 2018-10-12 DIAGNOSIS — M6281 Muscle weakness (generalized): Secondary | ICD-10-CM

## 2018-10-12 DIAGNOSIS — M25562 Pain in left knee: Secondary | ICD-10-CM

## 2018-10-12 NOTE — Therapy (Signed)
[facility] [address] [place], [place], [address] Phone: [phone]   Fax:  [phone]  Physical Therapy Treatment  Patient Details  Name: Nathaniel [name] MRN: [id number] Date of Birth: [birth year] Referring Provider (PT): Durwin Nora Katharina Caper, [place]   Encounter Date: 10/12/2018  PT End of Session - 10/12/18 1359    Visit Number  13   10th visit PN completed #10   Number of Visits  20    Date for PT Re-Evaluation  10/08/18    Authorization Type  BCBS Other (no auth required, 60 visits per year PT/OT combined, $20 co-pay)    Authorization Time Period  08/13/18 - 10/08/18; 09/26/18-10/26/18   mini-re-assess 09/10/18   Authorization - Visit Number  13    Authorization - Number of Visits  60    PT Start Time  0948    PT Stop Time  1030    PT Time Calculation (min)  42 min    Activity Tolerance  Patient tolerated treatment well;No increased pain    Behavior During Therapy  [facility] for tasks assessed/performed       History reviewed. No pertinent past medical history.  Past Surgical History:  Procedure Laterality Date  . ANTERIOR CRUCIATE LIGAMENT REPAIR Left 07/16/2018    There were no vitals filed for this visit.  Subjective Assessment - 10/12/18 1357    Subjective  pt states he still has some LE soreness.  STates he returned to MD and was told he could prob start running in 6 weeks but no instruction on when he can RTW.  States he gave him a presription for a new knee brace.   No pain today, continues to walk  approx 3 miles daily.     Currently in Pain?  No/denies                       [facility] Adult PT Treatment/Exercise - 10/12/18 0001      Knee/Hip Exercises: Aerobic   Elliptical  3 minutes forward, 3 minutes backward; level 7       Knee/Hip Exercises: Machines for Strengthening   Cybex Knee Extension  1 RM: Rt: 60#, Lt: 10#    Cybex Knee Flexion  1 RM: Rt: 99.5#   Lt:40.5#     Total Gym Leg Press  BodyCraft  Leg Press: Lt LE only, 40lbs, 2x 15 reps    Other Machine  biodex full ROM.   150-120-90 10 reps, 1 set each      Knee/Hip Exercises: Standing   Heel Raises  Left;15 reps;2 sets    Heel Raises Limitations  1 UE only left only    Side Lunges  Both;2 sets;15 reps;Limitations    Side Lunges Limitations  towel slide    Lateral Step Up  Left;Hand Hold: 0;15 reps;Step Height: 6"    Lateral Step Up Limitations  working on eccentric phase, keeping hips level and controlled.    Forward Step Up  Left;2 sets;15 reps;Hand Hold: 0    Forward Step Up Limitations  12" box step up with SLS at top    Functional Squat  2 sets;Other (comment)    Functional Squat Limitations  bulgarian squats: 12" box, 10lbs bil UE      Knee/Hip Exercises: Seated   Stool Scoot - Round Trips  1Rt down back hall, Lt only for hamstring activation with upright posturing  PT Short Term Goals - 09/26/18 1537      PT SHORT TERM GOAL #1   Title  Patient will be independent with HEP, updated PRN, toprogress safely through rehab protocol.     Time  1    Period  Weeks    Status  Achieved    Target Date  08/20/18      PT SHORT TERM GOAL #2   Title  Patient will achieve AROM 0-100 to progress safely to next phase of protocol for strengthening and mobility/ROM exercises.     Time  3    Period  Weeks    Status  Achieved    Target Date  09/03/18      PT SHORT TERM GOAL #3   Title  Patient will demonstrate normal gait pattern with no brace ro crutches to indicate good quad control and tolerance to FWB to improve mobility in daily activities and progress to next phase of protocol.     Time  4    Period  Weeks    Status  Achieved    Target Date  09/10/18        PT Long Term Goals - 09/26/18 1537      PT LONG TERM GOAL #1   Title  Patient will achieve 0-130 degrees on Lt knee AROM to equal Rt Knee mobility to imrpove fucntional giat and mobility.     Time  6    Period  Weeks    Status  On-going       PT LONG TERM GOAL #2   Title  Patient will demosntrate 70% hamstring strength on Lt knee compared to Rt knee to indicate imrpovements in functional strength with 1 rep max test.     Time  8    Period  Weeks    Status  On-going      PT LONG TERM GOAL #3   Title  Patient will demosntrate 60% quadriceps strength on Lt knee compared to Rt knee to indicate imrpovements in functional strength with 1 rep max test.     Time  8    Period  Weeks    Status  On-going            Plan - 10/12/18 1359    Clinical Impression Statement  Pt overall doing well in ACL protocol s/P 12 weeks.  1 RM completed this session with extreme weakness noted in Lt LE.  quad is at 17% and hamstring at 41%.  These will need to be at 85% before adding plyometrics.  Added stool scoots to target hamstring and increased to full ROM with biodex exercise.  Pt with improving form and eccentric strength with activities.     Personal Factors and Comorbidities  Profession    Examination-Activity Limitations  Bathing;Stairs;Sit;Stand;Transfers    Examination-Participation Restrictions  Driving;Yard Work;Cleaning;Community Activity;Laundry;Other   work   Stability/Clinical Decision Making  Stable/Uncomplicated    Rehab Potential  Excellent    PT Frequency  2x / week   1x/week for 4, 2x/week for 4   PT Duration  8 weeks    PT Treatment/Interventions  ADLs/Self Care Home Management;Aquatic Therapy;Cryotherapy;Electrical Stimulation;Moist Heat;DME Instruction;Gait training;Stair training;Functional mobility training;Therapeutic activities;Therapeutic exercise;Balance training;Neuromuscular re-education;Patient/family education;Manual techniques;Scar mobilization;Passive range of motion;Taping    PT Next Visit Plan  Pt will be 13  weeks post-op ( 10/15/18). [facility].  continue with primary focus on improving Lt quad/hams strength.     PT Home Exercise Plan  Eval: quad set, heel slide,  supine knee flexion stretch with rope, hip extension, hamsring curl, hip abduction, heel raises; 08/20/18 - patella mobilization, hamstring isometric, prone knee hang    Consulted and Agree with Plan of Care  Patient       Patient will benefit from skilled therapeutic intervention in order to improve the following deficits and impairments:  Abnormal gait, Decreased activity tolerance, Decreased endurance, Decreased range of motion, Decreased strength, Increased edema, Difficulty walking, Decreased mobility, Decreased balance, Increased fascial restricitons  Visit Diagnosis: Stiffness of left knee, not elsewhere classified  Acute pain of left knee  Muscle weakness (generalized)     Problem List Patient Active Problem List   Diagnosis Date Noted  . CONTUSION OF FOREARM 03/17/2009   Lurena Nida, PTA/CLT [phone]  Lurena Nida 10/12/2018, 2:05 PM  [facility] [address] [place], [place], [address] Phone: [phone]   Fax:  [phone]  Name: DAROLD MILEY MRN: [id number] Date of Birth: [birth year]

## 2018-10-15 ENCOUNTER — Ambulatory Visit (HOSPITAL_COMMUNITY): Payer: BC Managed Care – PPO | Attending: Physician Assistant | Admitting: Physical Therapy

## 2018-10-15 ENCOUNTER — Encounter (HOSPITAL_COMMUNITY): Payer: Self-pay | Admitting: Physical Therapy

## 2018-10-15 ENCOUNTER — Other Ambulatory Visit: Payer: Self-pay

## 2018-10-15 DIAGNOSIS — M6281 Muscle weakness (generalized): Secondary | ICD-10-CM

## 2018-10-15 DIAGNOSIS — M25662 Stiffness of left knee, not elsewhere classified: Secondary | ICD-10-CM | POA: Insufficient documentation

## 2018-10-15 DIAGNOSIS — M25562 Pain in left knee: Secondary | ICD-10-CM | POA: Insufficient documentation

## 2018-10-15 NOTE — Therapy (Signed)
[facility] [address] [place], [place], [address] Phone: [phone]   Fax:  [phone]  Physical Therapy Treatment  Patient Details  Name: Nathaniel [name] MRN: [id number] Date of Birth: 1996-03-05 Referring Provider (PT): Durwin Nora Katharina Caper, [place]   Encounter Date: 10/15/2018  PT End of Session - 10/15/18 0940    Visit Number  14   10th visit PN completed #10   Number of Visits  20    Date for PT Re-Evaluation  10/26/18    Authorization Type  [employer] Other (no auth required, 60 visits per year PT/OT combined, $20 co-pay)    Authorization Time Period  08/13/18 - 10/08/18; 09/26/18-10/26/18   mini-re-assess 09/10/18   Authorization - Visit Number  14    Authorization - Number of Visits  60    PT Start Time  0935    PT Stop Time  1013    PT Time Calculation (min)  38 min    Activity Tolerance  Patient tolerated treatment well;No increased pain    Behavior During Therapy  [facility] for tasks assessed/performed       History reviewed. No pertinent past medical history.  Past Surgical History:  Procedure Laterality Date  . ANTERIOR CRUCIATE LIGAMENT REPAIR Left 07/16/2018    There were no vitals filed for this visit.  Subjective Assessment - 10/15/18 0937    Subjective  Patient denied any pain this session. He reported that he has been doing his exercises at home. He stated he is still having issues with bending it certain ways.     Currently in Pain?  No/denies                       [facility] Adult PT Treatment/Exercise - 10/15/18 0001      Knee/Hip Exercises: Stretches   Gastroc Stretch  Both;3 reps;30 seconds    Gastroc Stretch Limitations  Slant board      Knee/Hip Exercises: Aerobic   Elliptical  3 minutes forward, 3 minutes backward; level 7       Knee/Hip Exercises: Standing   Heel Raises  Left;15 reps;2 sets    Heel Raises Limitations  Left LE    Side Lunges  Both;2 sets;15 reps;Limitations    Side Lunges  Limitations  towel slide    Lateral Step Up  Left;Hand Hold: 0;15 reps;Step Height: 6"    Lateral Step Up Limitations  working on eccentric phase, keeping hips level and controlled.    Forward Step Up  Left;2 sets;15 reps;Hand Hold: 0    Forward Step Up Limitations  12" box step up with SLS at top knee drive    Functional Squat  2 sets;Other (comment)    Functional Squat Limitations  bulgarian squats: 12" box, 10lbs bil UE. 2 x 10 each LE    Other Standing Knee Exercises  Single leg skaters (heel raise, but no jump) 2 x 15 each LE      Knee/Hip Exercises: Seated   Stool Scoot - Round Trips  1Rt down back hall, Lt only for hamstring activation with upright posturing               PT Short Term Goals - 09/26/18 1537      PT SHORT TERM GOAL #1   Title  Patient will be independent with HEP, updated PRN, toprogress safely through rehab protocol.     Time  1    Period  Weeks    Status  Achieved    Target Date  08/20/18      PT SHORT TERM GOAL #2   Title  Patient will achieve AROM 0-100 to progress safely to next phase of protocol for strengthening and mobility/ROM exercises.     Time  3    Period  Weeks    Status  Achieved    Target Date  09/03/18      PT SHORT TERM GOAL #3   Title  Patient will demonstrate normal gait pattern with no brace ro crutches to indicate good quad control and tolerance to FWB to improve mobility in daily activities and progress to next phase of protocol.     Time  4    Period  Weeks    Status  Achieved    Target Date  09/10/18        PT Long Term Goals - 09/26/18 1537      PT LONG TERM GOAL #1   Title  Patient will achieve 0-130 degrees on Lt knee AROM to equal Rt Knee mobility to imrpove fucntional giat and mobility.     Time  6    Period  Weeks    Status  On-going      PT LONG TERM GOAL #2   Title  Patient will demosntrate 70% hamstring strength on Lt knee compared to Rt knee to indicate imrpovements in functional strength with 1 rep max  test.     Time  8    Period  Weeks    Status  On-going      PT LONG TERM GOAL #3   Title  Patient will demosntrate 60% quadriceps strength on Lt knee compared to Rt knee to indicate imrpovements in functional strength with 1 rep max test.     Time  8    Period  Weeks    Status  On-going            Plan - 10/15/18 1023    Clinical Impression Statement  Focused on quad strengthening this session. Added single leg skaters this session with heel raise, but not with a jump/hop. Patient demonstrated some decreased range of motion with heel rise with this, but overall tolerated well. Patient denied pain throughout the session. Patient would benefit from continued skilled physical therapy in order to continue progressing patient's strength, balance, and overall functional mobility.     Personal Factors and Comorbidities  Profession    Examination-Activity Limitations  Bathing;Stairs;Sit;Stand;Transfers    Examination-Participation Restrictions  Driving;Yard Work;Cleaning;Community Activity;Laundry;Other   work   Stability/Clinical Decision Making  Stable/Uncomplicated    Rehab Potential  Excellent    PT Frequency  2x / week   1x/week for 4, 2x/week for 4   PT Duration  8 weeks    PT Treatment/Interventions  ADLs/Self Care Home Management;Aquatic Therapy;Cryotherapy;Electrical Stimulation;Moist Heat;DME Instruction;Gait training;Stair training;Functional mobility training;Therapeutic activities;Therapeutic exercise;Balance training;Neuromuscular re-education;Patient/family education;Manual techniques;Scar mobilization;Passive range of motion;Taping    PT Next Visit Plan  Pt will be 13  weeks post-op ( 10/15/18). Follow [facility] orthopedic ACL Autograft protocol.  continue with primary focus on improving Lt quad/hams strength.     PT Home Exercise Plan  Eval: quad set, heel slide, supine knee flexion stretch with rope, hip extension, hamsring curl, hip abduction, heel raises; 08/20/18 - patella  mobilization, hamstring isometric, prone knee hang    Consulted and Agree with Plan of Care  Patient       Patient will benefit from skilled therapeutic intervention in order to improve  the following deficits and impairments:  Abnormal gait, Decreased activity tolerance, Decreased endurance, Decreased range of motion, Decreased strength, Increased edema, Difficulty walking, Decreased mobility, Decreased balance, Increased fascial restricitons  Visit Diagnosis: Stiffness of left knee, not elsewhere classified  Acute pain of left knee  Muscle weakness (generalized)     Problem List Patient Active Problem List   Diagnosis Date Noted  . CONTUSION OF FOREARM 03/17/2009   Verne CarrowMacy Hong Timm PT, DPT 10:25 AM, 10/15/18 [phone]  [facility] [address] , [place], [address] Phone: [phone]   Fax:  [phone]  Name: Nathaniel [name] MRN: [id number] Date of Birth: [birth year]

## 2018-10-17 ENCOUNTER — Other Ambulatory Visit: Payer: Self-pay

## 2018-10-17 ENCOUNTER — Encounter (HOSPITAL_COMMUNITY): Payer: Self-pay | Admitting: Physical Therapy

## 2018-10-17 ENCOUNTER — Ambulatory Visit (HOSPITAL_COMMUNITY): Payer: BC Managed Care – PPO | Admitting: Physical Therapy

## 2018-10-17 DIAGNOSIS — M25662 Stiffness of left knee, not elsewhere classified: Secondary | ICD-10-CM

## 2018-10-17 DIAGNOSIS — M25562 Pain in left knee: Secondary | ICD-10-CM

## 2018-10-17 DIAGNOSIS — M6281 Muscle weakness (generalized): Secondary | ICD-10-CM

## 2018-10-17 NOTE — Therapy (Signed)
[facility] [address] [place], [place], [address] Phone: [phone]   Fax:  [phone]  Physical Therapy Treatment  Patient Details  Name: RAMIRO PANGILINAN MRN: [id number] Date of Birth: Aug 16, 1995 Referring Provider (PT): Durwin Nora Katharina Caper, [place]   Encounter Date: 10/17/2018  PT End of Session - 10/17/18 1021    Visit Number  15   10th visit PN completed #10   Number of Visits  20    Date for PT Re-Evaluation  10/26/18    Authorization Type  [employer] Other (no auth required, 60 visits per year PT/OT combined, $20 co-pay)    Authorization Time Period  08/13/18 - 10/08/18; 09/26/18-10/26/18   mini-re-assess 09/10/18   Authorization - Visit Number  15    Authorization - Number of Visits  60    PT Start Time  0935    PT Stop Time  1015    PT Time Calculation (min)  40 min    Activity Tolerance  Patient tolerated treatment well;No increased pain    Behavior During Therapy  [facility] for tasks assessed/performed       History reviewed. No pertinent past medical history.  Past Surgical History:  Procedure Laterality Date  . ANTERIOR CRUCIATE LIGAMENT REPAIR Left 07/16/2018    There were no vitals filed for this visit.  Subjective Assessment - 10/17/18 0937    Subjective  Patient reported that he is not having any pain. He reported having some soreness for the rest of the day following the last session, but none currently.     Currently in Pain?  No/denies                       [facility] Adult PT Treatment/Exercise - 10/17/18 0001      Knee/Hip Exercises: Stretches   Gastroc Stretch  Both;3 reps;30 seconds    Gastroc Stretch Limitations  Slant board      Knee/Hip Exercises: Aerobic   Elliptical  3 minutes forward, 3 minutes backward; level 7       Knee/Hip Exercises: Machines for Strengthening   Total Gym Leg Press  BodyCraft Leg Press: Lt LE only, 40lbs, 2x 10 each reps    Other Machine  biodex Isokinetic full ROM.    135-105-75 10 reps, 1 set of 75 2 sets of 105 and 135      Knee/Hip Exercises: Standing   Heel Raises  Left;15 reps;2 sets    Heel Raises Limitations  Left LE    Side Lunges  Both;2 sets;15 reps;Limitations    Side Lunges Limitations  towel slide    Lateral Step Up  Left;Hand Hold: 0;15 reps;Step Height: 6"    Lateral Step Up Limitations  working on eccentric phase, keeping hips level and controlled.    Forward Step Up  Left;2 sets;15 reps;Hand Hold: 0    Forward Step Up Limitations  12" box step up with SLS at top knee drive      Knee/Hip Exercises: Seated   Stool Scoot - Round Trips  1Rt down back hall, Lt only for hamstring activation with upright posturing               PT Short Term Goals - 09/26/18 1537      PT SHORT TERM GOAL #1   Title  Patient will be independent with HEP, updated PRN, toprogress safely through rehab protocol.     Time  1    Period  Weeks  Status  Achieved    Target Date  08/20/18      PT SHORT TERM GOAL #2   Title  Patient will achieve AROM 0-100 to progress safely to next phase of protocol for strengthening and mobility/ROM exercises.     Time  3    Period  Weeks    Status  Achieved    Target Date  09/03/18      PT SHORT TERM GOAL #3   Title  Patient will demonstrate normal gait pattern with no brace ro crutches to indicate good quad control and tolerance to FWB to improve mobility in daily activities and progress to next phase of protocol.     Time  4    Period  Weeks    Status  Achieved    Target Date  09/10/18        PT Long Term Goals - 09/26/18 1537      PT LONG TERM GOAL #1   Title  Patient will achieve 0-130 degrees on Lt knee AROM to equal Rt Knee mobility to imrpove fucntional giat and mobility.     Time  6    Period  Weeks    Status  On-going      PT LONG TERM GOAL #2   Title  Patient will demosntrate 70% hamstring strength on Lt knee compared to Rt knee to indicate imrpovements in functional strength with 1 rep max  test.     Time  8    Period  Weeks    Status  On-going      PT LONG TERM GOAL #3   Title  Patient will demosntrate 60% quadriceps strength on Lt knee compared to Rt knee to indicate imrpovements in functional strength with 1 rep max test.     Time  8    Period  Weeks    Status  On-going            Plan - 10/17/18 1025    Clinical Impression Statement  Continued focus on left hamstring and quadriceps strengthening. This session progressed on isokinetic to 135-105-75. Noted some shakiness and muscle fatigue when at 75. Resumed single leg legpress this session. Plan to incorporate more single leg closed chain exercises next session. Patient would benefit from continued skilled physical therapy in order to continue progressing towards functional goals.     Personal Factors and Comorbidities  Profession    Examination-Activity Limitations  Bathing;Stairs;Sit;Stand;Transfers    Examination-Participation Restrictions  Driving;Yard Work;Cleaning;Community Activity;Laundry;Other   work   Stability/Clinical Decision Making  Stable/Uncomplicated    Rehab Potential  Excellent    PT Frequency  2x / week   1x/week for 4, 2x/week for 4   PT Duration  8 weeks    PT Treatment/Interventions  ADLs/Self Care Home Management;Aquatic Therapy;Cryotherapy;Electrical Stimulation;Moist Heat;DME Instruction;Gait training;Stair training;Functional mobility training;Therapeutic activities;Therapeutic exercise;Balance training;Neuromuscular re-education;Patient/family education;Manual techniques;Scar mobilization;Passive range of motion;Taping    PT Next Visit Plan  Pt will be 13  weeks post-op ( 10/15/18). Add single leg skaters back in next session as tolerated and bulgarian squats. [facility].  continue with primary focus on improving Lt quad/hams strength.     PT Home Exercise Plan  Eval: quad set, heel slide, supine knee flexion stretch with rope, hip extension, hamsring  curl, hip abduction, heel raises; 08/20/18 - patella mobilization, hamstring isometric, prone knee hang    Consulted and Agree with Plan of Care  Patient       Patient  will benefit from skilled therapeutic intervention in order to improve the following deficits and impairments:  Abnormal gait, Decreased activity tolerance, Decreased endurance, Decreased range of motion, Decreased strength, Increased edema, Difficulty walking, Decreased mobility, Decreased balance, Increased fascial restricitons  Visit Diagnosis: Stiffness of left knee, not elsewhere classified  Acute pain of left knee  Muscle weakness (generalized)     Problem List Patient Active Problem List   Diagnosis Date Noted  . CONTUSION OF FOREARM 03/17/2009   Verne Carrow PT, DPT 10:27 AM, 10/17/18 [phone]  [facility] [address] [place], [place], [address] Phone: [phone]   Fax:  [phone]  Name: STEVESON BOLING MRN: [id number] Date of Birth: 1996/01/24

## 2018-10-19 ENCOUNTER — Telehealth (HOSPITAL_COMMUNITY): Payer: Self-pay | Admitting: Physical Therapy

## 2018-10-19 NOTE — Telephone Encounter (Signed)
Called pt he aqreed to come in at 8:15 10/31/2018  to help with scheduling

## 2018-10-22 ENCOUNTER — Ambulatory Visit (HOSPITAL_COMMUNITY): Payer: BC Managed Care – PPO | Admitting: Physical Therapy

## 2018-10-22 ENCOUNTER — Other Ambulatory Visit: Payer: Self-pay

## 2018-10-22 DIAGNOSIS — M25662 Stiffness of left knee, not elsewhere classified: Secondary | ICD-10-CM | POA: Diagnosis not present

## 2018-10-22 DIAGNOSIS — M6281 Muscle weakness (generalized): Secondary | ICD-10-CM

## 2018-10-22 DIAGNOSIS — M25562 Pain in left knee: Secondary | ICD-10-CM

## 2018-10-22 NOTE — Therapy (Signed)
[facility] [address] [place], [place], [address] Phone: [phone]   Fax:  [phone]  Physical Therapy Treatment  Patient Details  Name: Nathaniel ReeksJonathan R Foisy MRN: [id number] Date of Birth: 10-31-1995 Referring Provider (PT): Durwin Noraixon, Katharina Caperhomas [place], [place]   Encounter Date: 10/22/2018  PT End of Session - 10/22/18 1020    Visit Number  16   10th visit PN completed #10   Number of Visits  20    Date for PT Re-Evaluation  10/26/18    Authorization Type  [employer] Other (no auth required, 60 visits per year PT/OT combined, $20 co-pay)    Authorization Time Period  08/13/18 - 10/08/18; 09/26/18-10/26/18   mini-re-assess 09/10/18   Authorization - Visit Number  16    Authorization - Number of Visits  60    PT Start Time  0932    PT Stop Time  1014    PT Time Calculation (min)  42 min    Activity Tolerance  Patient tolerated treatment well;No increased pain    Behavior During Therapy  [facility] for tasks assessed/performed       No past medical history on file.  Past Surgical History:  Procedure Laterality Date  . ANTERIOR CRUCIATE LIGAMENT REPAIR Left 07/16/2018    There were no vitals filed for this visit.  Subjective Assessment - 10/22/18 0941    Subjective  pt states his soreness has improved, not really sore anymore.  STates no other issues or pain today.  Walking daily, completed 2.5 miles yesterday.     Currently in Pain?  No/denies                       [facility]/Exercise - 10/22/18 0001      Knee/Hip Exercises: Stretches   Gastroc Stretch  Both;3 reps;30 seconds    Gastroc Stretch Limitations  Slant board      Knee/Hip Exercises: Aerobic   Elliptical  3 minutes forward, 3 minutes backward; level 7       Knee/Hip Exercises: Machines for Strengthening   Total Gym Leg Press  BodyCraft Leg Press: Lt LE only, 50lbs, 2x 15 each reps    Other Machine  biodex Isokinetic full ROM.   2 sets of 10 at each  speed      Knee/Hip Exercises: Standing   Heel Raises  Left;15 reps;2 sets    Heel Raises Limitations  Left LE    Side Lunges  Both;2 sets;15 reps;Limitations    Side Lunges Limitations  towel slide    Forward Step Up  Left;2 sets;15 reps;Hand Hold: 0    Forward Step Up Limitations  12" box step up with SLS at top knee drive    Functional Squat  2 sets;Other (comment);15 reps    Functional Squat Limitations  bulgarian squats: foot on chair with 10lbs bil UE. 2 x 15 each LE      Knee/Hip Exercises: Seated   Stool Scoot - Round Trips  1Rt down back hall, Lt only for hamstring activation with upright posturing               PT Short Term Goals - 09/26/18 1537      PT SHORT TERM GOAL #1   Title  Patient will be independent with HEP, updated PRN, toprogress safely through rehab protocol.     Time  1    Period  Weeks    Status  Achieved    Target  Date  08/20/18      PT SHORT TERM GOAL #2   Title  Patient will achieve AROM 0-100 to progress safely to next phase of protocol for strengthening and mobility/ROM exercises.     Time  3    Period  Weeks    Status  Achieved    Target Date  09/03/18      PT SHORT TERM GOAL #3   Title  Patient will demonstrate normal gait pattern with no brace ro crutches to indicate good quad control and tolerance to FWB to improve mobility in daily activities and progress to next phase of protocol.     Time  4    Period  Weeks    Status  Achieved    Target Date  09/10/18        PT Long Term Goals - 09/26/18 1537      PT LONG TERM GOAL #1   Title  Patient will achieve 0-130 degrees on Lt knee AROM to equal Rt Knee mobility to imrpove fucntional giat and mobility.     Time  6    Period  Weeks    Status  On-going      PT LONG TERM GOAL #2   Title  Patient will demosntrate 70% hamstring strength on Lt knee compared to Rt knee to indicate imrpovements in functional strength with 1 rep max test.     Time  8    Period  Weeks    Status   On-going      PT LONG TERM GOAL #3   Title  Patient will demosntrate 60% quadriceps strength on Lt knee compared to Rt knee to indicate imrpovements in functional strength with 1 rep max test.     Time  8    Period  Weeks    Status  On-going            Plan - 10/22/18 1022    Clinical Impression Statement  Pt doing well overall with ACL protocol.  Able to increase weight for leg press and increase reps for most exercises.  Pt only required minimal cues for form.  Observed walking at fast pace on treadmill with no gait deficits noted.      Personal Factors and Comorbidities  Profession    Examination-Activity Limitations  Bathing;Stairs;Sit;Stand;Transfers    Examination-Participation Restrictions  Driving;Yard Work;Cleaning;Community Activity;Laundry;Other   work   Stability/Clinical Decision Making  Stable/Uncomplicated    Rehab Potential  Excellent    PT Frequency  2x / week   1x/week for 4, 2x/week for 4   PT Duration  8 weeks    PT Treatment/Interventions  ADLs/Self Care Home Management;Aquatic Therapy;Cryotherapy;Electrical Stimulation;Moist Heat;DME Instruction;Gait training;Stair training;Functional mobility training;Therapeutic activities;Therapeutic exercise;Balance training;Neuromuscular re-education;Patient/family education;Manual techniques;Scar mobilization;Passive range of motion;Taping    PT Next Visit Plan  Pt currently 14  weeks post-op ( 10/22/18). Add single leg skaters back in next session as tolerated.   Follow [facility] orthopedic ACL Autograft protocol.  continue with primary focus on improving Lt quad/hams strength.     PT Home Exercise Plan  Eval: quad set, heel slide, supine knee flexion stretch with rope, hip extension, hamsring curl, hip abduction, heel raises; 08/20/18 - patella mobilization, hamstring isometric, prone knee hang.  Begin bilateral LE hopping in place, laterals and A/P next session and cybex strengthening for quads and hams (singles).     Consulted and Agree with Plan of Care  Patient       Patient will benefit from  skilled therapeutic intervention in order to improve the following deficits and impairments:  Abnormal gait, Decreased activity tolerance, Decreased endurance, Decreased range of motion, Decreased strength, Increased edema, Difficulty walking, Decreased mobility, Decreased balance, Increased fascial restricitons  Visit Diagnosis: Stiffness of left knee, not elsewhere classified  Acute pain of left knee  Muscle weakness (generalized)     Problem List Patient Active Problem List   Diagnosis Date Noted  . CONTUSION OF FOREARM 03/17/2009   Lurena NidaAmy B Frazier, PTA/CLT [phone]  Lurena NidaFrazier, Amy B 10/22/2018, 10:28 AM   [facility] [address] [place], [place], [address] Phone: [phone]   Fax:  [phone]  Name: Nathaniel ReeksJonathan R Hershkowitz MRN: [id number] Date of Birth: 02-08-1996

## 2018-10-24 ENCOUNTER — Ambulatory Visit (HOSPITAL_COMMUNITY): Payer: BC Managed Care – PPO | Admitting: Physical Therapy

## 2018-10-24 ENCOUNTER — Encounter (HOSPITAL_COMMUNITY): Payer: Self-pay | Admitting: Physical Therapy

## 2018-10-24 ENCOUNTER — Other Ambulatory Visit: Payer: Self-pay

## 2018-10-24 DIAGNOSIS — M25662 Stiffness of left knee, not elsewhere classified: Secondary | ICD-10-CM

## 2018-10-24 DIAGNOSIS — M25562 Pain in left knee: Secondary | ICD-10-CM

## 2018-10-24 DIAGNOSIS — M6281 Muscle weakness (generalized): Secondary | ICD-10-CM

## 2018-10-24 NOTE — Therapy (Signed)
[name] [address] [place], [place], [address] Phone: [phone]   Fax:  [phone]  Physical Therapy Treatment  Patient Details  Name: Nathaniel ReeksJonathan R [name] MRN: [id number] Date of Birth: 04/13/1996 Referring Provider (PT): Durwin Noraixon, Katharina Caperhomas [place], [place]   Encounter Date: 10/24/2018  PT End of Session - 10/24/18 0939    Visit Number  17   10th visit PN completed #10   Number of Visits  20    Date for PT Re-Evaluation  10/26/18    Authorization Type  BC[employer] Other (no auth required, 60 visits per year PT/OT combined, $20 co-pay)    Authorization Time Period  08/13/18 - 10/08/18; 09/26/18-10/26/18   mini-re-assess 09/10/18   Authorization - Visit Number  17    Authorization - Number of Visits  60    PT Start Time  0931    PT Stop Time  1010    PT Time Calculation (min)  39 min    Activity Tolerance  Patient tolerated treatment well;No increased pain    Behavior During Therapy  [facility] assessed/performed       History reviewed. No pertinent past medical history.  Past Surgical History:  Procedure Laterality Date  . ANTERIOR CRUCIATE LIGAMENT REPAIR Left 07/16/2018    There were no vitals filed for this visit.  Subjective Assessment - 10/24/18 0933    Subjective  Patient reported that he had soreness following last session which lasted about 1 day. He came in wearing a knee brace, which he stated was because his MD wanted him to wear it. Denied any pain currently.     Currently in Pain?  No/denies                       [facility] - 10/24/18 0001      Knee/Hip Exercises: Stretches   Gastroc Stretch  Both;3 reps;30 seconds    Gastroc Stretch Limitations  Slant board      Knee/Hip Exercises: Aerobic   Elliptical  3 minutes forward, 3 minutes backward; level 7       Knee/Hip Exercises: Machines for Strengthening   Total Gym Leg Press  BodyCraft Leg Press: Lt LE only, 50lbs, 2x 15 each  reps      Knee/Hip Exercises: Standing   Heel Raises  Left;15 reps;2 sets;3 seconds    Heel Raises Limitations  Left LE    Side Lunges  Both;2 sets;15 reps;Limitations    Side Lunges Limitations  towel slide    Forward Step Up  Left;2 sets;15 reps;Hand Hold: 0    Forward Step Up Limitations  12" box step up with SLS at top knee drive    SLS  [id number]'' each LE intermittent UE    Other Standing Knee Exercises  Single leg skaters (intermittent heel raise, but no jump) 2 x 15 each LE    Other Standing Knee Exercises  Isometric squat while throwing blue weighted ball at rebounder 2 x 15      Knee/Hip Exercises: Seated   Stool Scoot - Round Trips  1Rt down back hall, Lt only for hamstring activation with upright posturing               PT Short Term Goals - 09/26/18 1537      PT SHORT TERM GOAL #1   Title  Patient will be independent with HEP, updated PRN, toprogress safely through rehab protocol.     Time  1  Period  Weeks    Status  Achieved    Target Date  08/20/18      PT SHORT TERM GOAL #2   Title  Patient will achieve AROM 0-100 to progress safely to next phase of protocol for strengthening and mobility/ROM exercises.     Time  3    Period  Weeks    Status  Achieved    Target Date  09/03/18      PT SHORT TERM GOAL #3   Title  Patient will demonstrate normal gait pattern with no brace ro crutches to indicate good quad control and tolerance to FWB to improve mobility in daily activities and progress to next phase of protocol.     Time  4    Period  Weeks    Status  Achieved    Target Date  09/10/18        PT Long Term Goals - 09/26/18 1537      PT LONG TERM GOAL #1   Title  Patient will achieve 0-130 degrees on Lt knee AROM to equal Rt Knee mobility to imrpove fucntional giat and mobility.     Time  6    Period  Weeks    Status  On-going      PT LONG TERM GOAL #2   Title  Patient will demosntrate 70% hamstring strength on Lt knee compared to Rt knee to  indicate imrpovements in functional strength with 1 rep max test.     Time  8    Period  Weeks    Status  On-going      PT LONG TERM GOAL #3   Title  Patient will demosntrate 60% quadriceps strength on Lt knee compared to Rt knee to indicate imrpovements in functional strength with 1 rep max test.     Time  8    Period  Weeks    Status  On-going            Plan - 10/24/18 0950    Clinical Impression Statement  Continued with established plan of care this session. Added double leg isometric squat with rebounder this session using blue weighted ball. Patient continued to demonstrate muscular fatigue on the left lower extremity. Patient would benefit from continued skilled physical therapy in order to continue progressing towards functional goals.     Personal Factors and Comorbidities  Profession    Examination-Activity Limitations  Bathing;Stairs;Sit;Stand;Transfers    Examination-Participation Restrictions  Driving;Yard Work;Cleaning;Community Activity;Laundry;Other   work   Stability/Clinical Decision Making  Stable/Uncomplicated    Rehab Potential  Excellent    PT Frequency  2x / week   1x/week for 4, 2x/week for 4   PT Duration  8 weeks    PT Treatment/Interventions  ADLs/Self Care Home Management;Aquatic Therapy;Cryotherapy;Electrical Stimulation;Moist Heat;DME Instruction;Gait training;Stair training;Functional mobility training;Therapeutic activities;Therapeutic exercise;Balance training;Neuromuscular re-education;Patient/family education;Manual techniques;Scar mobilization;Passive range of motion;Taping    PT Next Visit Plan  Pt currently 14  weeks post-op ( 10/22/18). Update patient's HEP next session with weightbearing strengthening within protocol. Follow [facility] orthopedic ACL Autograft protocol.  continue with primary focus on improving Lt quad/hams strength.     PT Home Exercise Plan  Eval: quad set, heel slide, supine knee flexion stretch with rope, hip extension,  hamsring curl, hip abduction, heel raises; 08/20/18 - patella mobilization, hamstring isometric, prone knee hang.  Begin bilateral LE hopping in place, laterals and A/P next session and cybex strengthening for quads and hams (singles).    Consulted and Agree  with Plan of Care  Patient       Patient will benefit from skilled therapeutic intervention in order to improve the following deficits and impairments:  Abnormal gait, Decreased activity tolerance, Decreased endurance, Decreased range of motion, Decreased strength, Increased edema, Difficulty walking, Decreased mobility, Decreased balance, Increased fascial restricitons  Visit Diagnosis: Stiffness of left knee, not elsewhere classified  Acute pain of left knee  Muscle weakness (generalized)     Problem List Patient Active Problem List   Diagnosis Date Noted  . CONTUSION OF FOREARM 03/17/2009   Verne CarrowMacy Deionte Spivack PT, DPT 10:18 AM, 10/24/18 [phone]  [name] [address] [place], [place], [address] Phone: [phone]   Fax:  [phone]  Name: Nathaniel ReeksJonathan R [name] MRN: [id number] Date of Birth: 11-13-1995

## 2018-10-29 ENCOUNTER — Encounter (HOSPITAL_COMMUNITY): Payer: Self-pay

## 2018-10-29 ENCOUNTER — Ambulatory Visit (HOSPITAL_COMMUNITY): Payer: BC Managed Care – PPO

## 2018-10-29 ENCOUNTER — Other Ambulatory Visit: Payer: Self-pay

## 2018-10-29 DIAGNOSIS — M25662 Stiffness of left knee, not elsewhere classified: Secondary | ICD-10-CM

## 2018-10-29 DIAGNOSIS — M6281 Muscle weakness (generalized): Secondary | ICD-10-CM

## 2018-10-29 DIAGNOSIS — M25562 Pain in left knee: Secondary | ICD-10-CM

## 2018-10-29 NOTE — Therapy (Signed)
[place] [address] [place], [place], [address] Phone: [phone]   Fax:  [phone]  Physical Therapy Treatment Progress Note  Patient Details  Name: Nathaniel [name] MRN: [id number] Date of Birth: 16-Jul-1995 Referring Provider (PT): Doren Custard Robb Matar, [place]   Encounter Date: 10/29/2018   Progress Note Reporting Period 09/26/2018 to 10/29/2018  See note below for Objective Data and Assessment of Progress/Goals.     PT End of Session - 10/29/18 1716    Visit Number  18   10th visit PN completed #10   Number of Visits  28    Date for PT Re-Evaluation  11/26/18    Authorization Type  BCBS Other (no auth required, 60 visits per year PT/OT combined, $20 co-pay)    Authorization Time Period  08/13/18 - 10/08/18; 09/26/18-10/26/18; NEW : 10/29/18-11/26/18   mini-re-assess 09/10/18   Authorization - Visit Number  18    Authorization - Number of Visits  60    PT Start Time  5885    PT Stop Time  1701    PT Time Calculation (min)  46 min    Activity Tolerance  Patient tolerated treatment well    Behavior During Therapy  [facility] for tasks assessed/performed       History reviewed. No pertinent past medical history.  Past Surgical History:  Procedure Laterality Date  . ANTERIOR CRUCIATE LIGAMENT REPAIR Left 07/16/2018    There were no vitals filed for this visit.  Subjective Assessment - 10/29/18 1614    Subjective  Patient reports 2 weeks ago he saw his doctor and they said to get the knee brace he is wearing today and to wear it. He is going back to them on July 7th. He reports he walked 3 miles at the track today and that he is a little sore from that.    Pertinent History  LT ACL Reconstruction hamstring autograft 07/16/18    Limitations  House hold activities;Standing;Walking    Patient Stated Goals  to get back to work and the gym    Currently in Pain?  Yes    Pain Score  5     Pain Location  Knee    Pain Orientation   Left;Anterior    Pain Descriptors / Indicators  Sore    Pain Type  Chronic pain;Surgical pain    Pain Onset  Today    Pain Frequency  Intermittent       [facility] PT Assessment - 10/29/18 0001      Assessment   Medical Diagnosis  Lt ACL Reconstruction with Hamstring Autograft    Referring Provider (PT)  Cline Crock, PA-C    Onset Date/Surgical Date  07/16/18    Next MD Visit  10/09/2018      Precautions   Precautions  Other (comment);Knee    Precaution Comments  [facility] ACL Autograft Protocol      Circumferential Edema   Circumferential - Right  --    Circumferential - Left   --      AROM   Right Knee Extension  0    Right Knee Flexion  135    Left Knee Extension  0    Left Knee Flexion  130   was 125 on 09/26/18     Strength   Overall Strength Comments  1 Rep Max Testing: (Quadriceps: Rt = 80lbs, Lt = 30lbs) (Hamstring: Rt = 132.5lbs, Lt = 70lbs)    Right Hip Flexion  --  Right Hip Extension  --    Right Hip ABduction  --    Left Hip Extension  --    Left Hip ABduction  --    Right Knee Flexion  --    Right Knee Extension  --    Left Knee Flexion  --    Right Ankle Dorsiflexion  --    Left Ankle Dorsiflexion  --        [facility] Adult PT Treatment/Exercise - 10/29/18 0001      Knee/Hip Exercises: Aerobic   Elliptical  5 minutes level 8       Knee/Hip Exercises: Machines for Strengthening   Cybex Knee Extension  1x 8; 1x 12 reps, Lt LE, 20lbs    Cybex Knee Flexion  2x 12 reps Lt LE, 45lbs    Other Machine  Single Leg RDL with 20lbs on pulley: 2x 15 reps Lt LE      Knee/Hip Exercises: Standing   Functional Squat  2 sets;Other (comment);15 reps    Functional Squat Limitations  bulgarian squats: foot on 16" box with 10lbs bil UE. 2 x 15 each LE      Knee/Hip Exercises: Supine   Bridges  Both;1 set;15 reps    Bridges Limitations  bridge walkout    Other Supine Knee/Hip Exercises  Isometric Single Leg Bridge with hamstrign slide/curl on  contralateral LE: 3x 10 reps bil LE        PT Education - 10/29/18 1708    Education Details  Educated on small gains in quad and hamstring strength and updated HEP to address ongoing edficits.    Person(s) Educated  Patient    Methods  Explanation;Handout    Comprehension  Returned demonstration;Verbalized understanding       PT Short Term Goals - 10/29/18 1720      PT SHORT TERM GOAL #1   Title  Patient will be independent with HEP, updated PRN, toprogress safely through rehab protocol.     Time  1    Period  Weeks    Status  Achieved    Target Date  08/20/18      PT SHORT TERM GOAL #2   Title  Patient will achieve AROM 0-100 to progress safely to next phase of protocol for strengthening and mobility/ROM exercises.     Time  3    Period  Weeks    Status  Achieved    Target Date  09/03/18      PT SHORT TERM GOAL #3   Title  Patient will demonstrate normal gait pattern with no brace ro crutches to indicate good quad control and tolerance to FWB to improve mobility in daily activities and progress to next phase of protocol.     Time  4    Period  Weeks    Status  Achieved    Target Date  09/10/18        PT Long Term Goals - 10/29/18 1720      PT LONG TERM GOAL #1   Title  Patient will achieve 0-130 degrees on Lt knee AROM to equal Rt Knee mobility to imrpove fucntional giat and mobility.     Baseline  10/29/18 = 0-130    Time  6    Period  Weeks    Status  Achieved      PT LONG TERM GOAL #2   Title  Patient will demosntrate 70% hamstring strength on Lt knee compared to Rt knee to indicate imrpovements in functional  strength with 1 rep max test.     Baseline  pt remains limited (Rt = 132.5lbs, LT = 70lbs) Lt is at 52% or Rt    Time  4   additional   Period  Weeks    Status  On-going    Target Date  11/26/18      PT LONG TERM GOAL #3   Title  Patient will demosntrate 60% quadriceps strength on Lt knee compared to Rt knee to indicate imrpovements in functional  strength with 1 rep max test.     Baseline  pt remains limited (Rt = 80lbs, LT = 30lbs) Lt is at 37.5% or Rt    Time  4   additional   Period  Weeks    Status  On-going    Target Date  11/26/18         Plan - 10/29/18 1717    Clinical Impression Statement  Pt re-assessed today and he has achieved full ROM on Lt LE from 0-130 degrees. He continues to be limited with Lt quadriceps and hamstrings strength. His Lt quad is functioning with ~37.5% of the strength of his Rt and Lt hamstring is at ~ 52% of the strength of his Rt. He was instructed today on updated HEP to target quad and hamstring strength and isolated SL exercises were added to target Lt LE. He will continue to benefit from skilled PT interventions to address ongoing impairments and progress for safe return to work.    Personal Factors and Comorbidities  Profession    Examination-Activity Limitations  Bathing;Stairs;Sit;Stand;Transfers    Examination-Participation Restrictions  Driving;Yard Work;Cleaning;Community Activity;Laundry;Other   work   Stability/Clinical Decision Making  Stable/Uncomplicated    Rehab Potential  Excellent    PT Frequency  2x / week   1x/week for 4, 2x/week for 4   PT Duration  8 weeks    PT Treatment/Interventions  ADLs/Self Care Home Management;Aquatic Therapy;Cryotherapy;Electrical Stimulation;Moist Heat;DME Instruction;Gait training;Stair training;Functional mobility training;Therapeutic activities;Therapeutic exercise;Balance training;Neuromuscular re-education;Patient/family education;Manual techniques;Scar mobilization;Passive range of motion;Taping    PT Next Visit Plan  Pt currently 15 weeks post-op (10/29/18). [facility].  continue with primary focus on improving Lt quad/hams strength. Focus on quad and hamstring strengthening on Lt LE isolate (use biodex and cybex) Add bil and SL hopping drills next session. Follow up on new HEP.    PT Home Exercise Plan   Eval: quad set, heel slide, supine knee flexion stretch with rope, hip extension, hamsring curl, hip abduction, heel raises; 08/20/18 - patella mobilization, hamstring isometric, prone knee hang. 10/29/18 - Nordic hamstring and quad curls, bridge walkouts, [place] squats    Consulted and Agree with Plan of Care  Patient       Patient will benefit from skilled therapeutic intervention in order to improve the following deficits and impairments:  Abnormal gait, Decreased activity tolerance, Decreased endurance, Decreased range of motion, Decreased strength, Increased edema, Difficulty walking, Decreased mobility, Decreased balance, Increased fascial restricitons  Visit Diagnosis: 1. Stiffness of left knee, not elsewhere classified   2. Acute pain of left knee   3. Muscle weakness (generalized)        Problem List Patient Active Problem List   Diagnosis Date Noted  . CONTUSION OF FOREARM 03/17/2009    Valentino Saxonachel Quinn-Brown, PT, DPT, [facility] Physical Therapist with [facility]  10/29/2018 5:30 PM    [place] [facility] [address] [place], [place], [address] Phone:  [phone]   Fax:  [phone]  Name: Nathaniel [name] MRN: [id number] Date of Birth: 10/13/1995

## 2018-10-29 NOTE — Patient Instructions (Signed)
Single Leg Lunge with Foot on Bench reps: 15 sets: 2 daily: 1 weekly: 7   Exercise image step 1   Exercise image step 2  use up to 25lbs in each hand   Setup  Begin in a wide staggered stance position with your back foot resting on a bench. Movement  Bend your front knee, lowering your body into a lunge position, then return to standing and repeat. Tip  Make sure to keep your abdominals tight and do not let your knee move forward past your toe or collapse inward during the exercise. Bridge Walk Out reps: 15 sets: 2 daily: 1 weekly: 7   Exercise image step 1   Exercise image step 2  Setup  Begin by lying on your back with your hips and knees bent and feet flat on the floor. Movement  Lift your hips off the ground into a bridge position, then slowly walk your heels away from your body one a time until your knees are nearly straight. Tip  Make sure to keep your abdominals tight and do not let your hips drop to either side as you move your legs. Disclaimer: This program provides exercises related to your condition that you can perform at home. As there is a risk of injury with any activity, use caution when performing exercises. If you experience any pain or discomfort, discontinue the exercises and contact your health care provider.  Login URL: [email or web address] . Access Code: [email or web address] . Date printed: 10/29/2018 Page 1  Kneeling Eccentric Hamstring Strengthening with Caregiver reps: 10 sets: 3 daily: 1 weekly: 7   Exercise image step 1   Exercise image step 2  do this exercise for your hamstrings as pictured and also for your quadriceps (3 sets of 10 each)   Setup  The patient should be in a tall kneeling position at the end of a mat or firm bed. The caregiver should be behind the patient anchoring their lower legs to the floor. Movement  The patient should tighten their abdominals and slowly lean their body as far forward as they can maintain control. Return to an  upright position and repeat. Tip  The patient should make sure to keep their body in a straight line and should not hinge at their hips during the exercise.

## 2018-10-31 ENCOUNTER — Ambulatory Visit (HOSPITAL_COMMUNITY): Payer: BC Managed Care – PPO

## 2018-10-31 ENCOUNTER — Other Ambulatory Visit: Payer: Self-pay

## 2018-10-31 ENCOUNTER — Encounter (HOSPITAL_COMMUNITY): Payer: Self-pay

## 2018-10-31 DIAGNOSIS — M25662 Stiffness of left knee, not elsewhere classified: Secondary | ICD-10-CM | POA: Diagnosis not present

## 2018-10-31 DIAGNOSIS — M25562 Pain in left knee: Secondary | ICD-10-CM

## 2018-10-31 DIAGNOSIS — M6281 Muscle weakness (generalized): Secondary | ICD-10-CM

## 2018-10-31 NOTE — Therapy (Signed)
[facility] [address] [place], [place], [address] Phone: [phone]   Fax:  [phone]  Physical Therapy Treatment  Patient Details  Name: Nathaniel ReeksJonathan R Brackins MRN: [id number] Date of Birth: 10/19/1995 Referring Provider (PT): Durwin Noraixon, Katharina Caperhomas Bradley, [place]   Encounter Date: 10/31/2018  PT End of Session - 10/31/18 0824    Visit Number  19   10th visit PN completed #10   Number of Visits  28    Date for PT Re-Evaluation  11/26/18    Authorization Type  BCBS Other (no auth required, 60 visits per year PT/OT combined, $20 co-pay)    Authorization Time Period  08/13/18 - 10/08/18; 09/26/18-10/26/18; NEW : 10/29/18-11/26/18   mini-re-assess 09/10/18   Authorization - Visit Number  19    Authorization - Number of Visits  60    PT Start Time  0816    PT Stop Time  0858    PT Time Calculation (min)  42 min    Activity Tolerance  Patient tolerated treatment well    Behavior During Therapy  [facility]       History reviewed. No pertinent past medical history.  Past Surgical History:  Procedure Laterality Date  . ANTERIOR CRUCIATE LIGAMENT REPAIR Left 07/16/2018    There were no vitals filed for this visit.  Subjective Assessment - 10/31/18 0818    Subjective  Patient reports he was sore on Monday after his threapy session but it got better yesterday. He states he tried this new exercises yesterday with help from family/friend and they went ok. He denies pain at start of session.    Pertinent History  LT ACL Reconstruction hamstring autograft 07/16/18    Limitations  House hold activities;Standing;Walking    Patient Stated Goals  to get back to work and the gym    Currently in Pain?  No/denies        [facility] - 10/31/18 0001      Knee/Hip Exercises: Stretches   Passive Hamstring Stretch  Left;2 reps;30 seconds;Limitations    Passive Hamstring Stretch Limitations  supine with rope    Quad  Stretch  Left;2 reps;30 seconds    Quad Stretch Limitations  prone with rope      Knee/Hip Exercises: Aerobic   Elliptical  5 minutes level 8       Knee/Hip Exercises: Machines for Strengthening   Cybex Knee Extension  2x 12 reps, Lt LE, 20lbs    Cybex Knee Flexion  2x 12 reps Lt LE, 45lbs    Total Gym Leg Press  BodyCraft Leg Press: Lt LE only, 50lbs, 3x 12 reps      Knee/Hip Exercises: Plyometrics   Other Plyometric Exercises  Jumping drills Bills: 3x 30 reps in place hops, fwd/bkwd hops, side to side hops      Knee/Hip Exercises: Standing   Functional Squat  2 sets;Other (comment);15 reps    Functional Squat Limitations  single leg squat to chair; Lt LE    Rebounder  2x 20 reps ball towass forward and 2x20 lateral toss, Lt SLS on black foam pad, 1kg ball    Other Standing Knee Exercises  Tall Kneeling: Nordic hamstring and quad curl - 2x 10 reps each      Knee/Hip Exercises: Seated   Other Seated Knee/Hip Exercises  Stool Scoot: hamstring 2x 30' RT, LT LE only        PT Education - 10/31/18 0839    Education Details  Educated on purpose of increaed weight and reducing repetitions. Educated on exercise form and encouraged to continue icing at home.    Person(s) Educated  Patient    Methods  Explanation    Comprehension  Verbalized understanding       PT Short Term Goals - 10/29/18 1720      PT SHORT TERM GOAL #1   Title  Patient will be independent with HEP, updated PRN, toprogress safely through rehab protocol.     Time  1    Period  Weeks    Status  Achieved    Target Date  08/20/18      PT SHORT TERM GOAL #2   Title  Patient will achieve AROM 0-100 to progress safely to next phase of protocol for strengthening and mobility/ROM exercises.     Time  3    Period  Weeks    Status  Achieved    Target Date  09/03/18      PT SHORT TERM GOAL #3   Title  Patient will demonstrate normal gait pattern with no brace ro crutches to indicate good quad control and tolerance to  FWB to improve mobility in daily activities and progress to next phase of protocol.     Time  4    Period  Weeks    Status  Achieved    Target Date  09/10/18        PT Long Term Goals - 10/29/18 1720      PT LONG TERM GOAL #1   Title  Patient will achieve 0-130 degrees on Lt knee AROM to equal Rt Knee mobility to imrpove fucntional giat and mobility.     Baseline  10/29/18 = 0-130    Time  6    Period  Weeks    Status  Achieved      PT LONG TERM GOAL #2   Title  Patient will demosntrate 70% hamstring strength on Lt knee compared to Rt knee to indicate imrpovements in functional strength with 1 rep max test.     Baseline  pt remains limited (Rt = 132.5lbs, LT = 70lbs) Lt is at 52% or Rt    Time  4   additional   Period  Weeks    Status  On-going    Target Date  11/26/18      PT LONG TERM GOAL #3   Title  Patient will demosntrate 60% quadriceps strength on Lt knee compared to Rt knee to indicate imrpovements in functional strength with 1 rep max test.     Baseline  pt remains limited (Rt = 80lbs, LT = 30lbs) Lt is at 37.5% or Rt    Time  4   additional   Period  Weeks    Status  On-going    Target Date  11/26/18        Plan - 10/31/18 0825    Clinical Impression Statement  Continued therapy focus on strengthening and isolating Lt LE due to descrepency in LE strength. Pt continued with SL squat variations including chair taps and SLS ball toss to reobounder. Pt performed stool scoots for hamstring strengthening this session and was noted to compensate for weakness with trunk momentum requiring cues to deter. He increased load for leg press with no complaints of pain/soreness and was able to complete 2 sets of 12 reps for leg extension with no pain as well compared to last session. Patient was encouraged to continue with updated HEP and ice after  exercises. He will continue to benefit from skilled PT interventions to progress strength in order to return to work and PLOF.     Personal Factors and Comorbidities  Profession    Examination-Activity Limitations  Bathing;Stairs;Sit;Stand;Transfers    Examination-Participation Restrictions  Driving;Yard Work;Cleaning;Community Activity;Laundry;Other   work   Stability/Clinical Decision Making  Stable/Uncomplicated    Rehab Potential  Excellent    PT Frequency  2x / week   1x/week for 4, 2x/week for 4   PT Duration  8 weeks    PT Treatment/Interventions  ADLs/Self Care Home Management;Aquatic Therapy;Cryotherapy;Electrical Stimulation;Moist Heat;DME Instruction;Gait training;Stair training;Functional mobility training;Therapeutic activities;Therapeutic exercise;Balance training;Neuromuscular re-education;Patient/family education;Manual techniques;Scar mobilization;Passive range of motion;Taping    PT Next Visit Plan  Pt currently 15 weeks post-op (10/29/18). Follow [facility] orthopedic ACL Autograft protocol.  continue with primary focus on improving Lt quad/hams strength. Focus on quad and hamstring strengthening on Lt LE isolate (use biodex and cybex) Add bil and SL hopping drills next session. Follow up on new HEP.    PT Home Exercise Plan  Eval: quad set, heel slide, supine knee flexion stretch with rope, hip extension, hamsring curl, hip abduction, heel raises; 08/20/18 - patella mobilization, hamstring isometric, prone knee hang. 10/29/18 - Nordic hamstring and quad curls, bridge walkouts, [place] squats    Consulted and Agree with Plan of Care  Patient       Patient will benefit from skilled therapeutic intervention in order to improve the following deficits and impairments:  Abnormal gait, Decreased activity tolerance, Decreased endurance, Decreased range of motion, Decreased strength, Increased edema, Difficulty walking, Decreased mobility, Decreased balance, Increased fascial restricitons  Visit Diagnosis: 1. Stiffness of left knee, not elsewhere classified   2. Acute pain of left knee   3. Muscle weakness  (generalized)        Problem List Patient Active Problem List   Diagnosis Date Noted  . CONTUSION OF FOREARM 03/17/2009    Kipp Brood, PT, DPT, [facility] Physical Therapist with [facility]  10/31/2018 8:56 AM    [employer] [address] [place], [place], [address] Phone: [phone]   Fax:  [phone]  Name: BERLEY GAMBRELL MRN: [id number] Date of Birth: 1996-02-11

## 2018-11-06 ENCOUNTER — Other Ambulatory Visit: Payer: Self-pay

## 2018-11-06 ENCOUNTER — Encounter (HOSPITAL_COMMUNITY): Payer: Self-pay | Admitting: Physical Therapy

## 2018-11-06 ENCOUNTER — Ambulatory Visit (HOSPITAL_COMMUNITY): Payer: BC Managed Care – PPO | Admitting: Physical Therapy

## 2018-11-06 DIAGNOSIS — M25662 Stiffness of left knee, not elsewhere classified: Secondary | ICD-10-CM

## 2018-11-06 DIAGNOSIS — M25562 Pain in left knee: Secondary | ICD-10-CM

## 2018-11-06 DIAGNOSIS — M6281 Muscle weakness (generalized): Secondary | ICD-10-CM

## 2018-11-06 NOTE — Therapy (Signed)
[place], [place], [address] Phone: [phone]   Fax:  [phone]  Physical Therapy Treatment  Patient Details  Name: Nathaniel [name] MRN: [id number] Date of Birth: [birth year] Referring Provider (PT): Doren Custard Robb Matar, [place]   Encounter Date: 11/06/2018  PT End of Session - 11/06/18 1125    Visit Number  20   20th visit PN completed #18   Number of Visits  28    Date for PT Re-Evaluation  11/26/18    Authorization Type  BCBS Other (no auth required, 60 visits per year PT/OT combined, $20 co-pay)    Authorization Time Period  08/13/18 - 10/08/18; 09/26/18-10/26/18; NEW : 10/29/18-11/26/18  20th visit PN completed #18   mini-re-assess 09/10/18   Authorization - Visit Number  20    Authorization - Number of Visits  60    PT Start Time  7062    PT Stop Time  1115    PT Time Calculation (min)  47 min    Activity Tolerance  Patient tolerated treatment well    Behavior During Therapy  [facility] for tasks assessed/performed       History reviewed. No pertinent past medical history.  Past Surgical History:  Procedure Laterality Date  . ANTERIOR CRUCIATE LIGAMENT REPAIR Left 07/16/2018    There were no vitals filed for this visit.  Subjective Assessment - 11/06/18 1028    Subjective  pt reports no issues or pain . Returns to MD in July, no discussion on RTW yet.    Currently in Pain?  No/denies                       [facility] Adult PT Treatment/Exercise - 11/06/18 0001      Knee/Hip Exercises: Stretches   Passive Hamstring Stretch  Left;2 reps;30 seconds;Limitations    Passive Hamstring Stretch Limitations  standing with 12" step    Gastroc Stretch  Both;3 reps;30 seconds    Gastroc Stretch Limitations  Slant board      Knee/Hip Exercises: Aerobic   Elliptical  5 minutes level 8       Knee/Hip Exercises: Machines for Strengthening   Cybex Knee Extension  3x 12 reps, Lt LE, 20lbs    Cybex Knee  Flexion  3x 12 reps Lt LE, 50lbs    Total Gym Leg Press  BodyCraft Leg Press: Lt LE only, 60lbs, 3x 12 reps    Other Machine  isokinetic biodex 1X10 each speed full ROM [id number]      Knee/Hip Exercises: Plyometrics   Other Plyometric Exercises  Jumping drills Bills: 3x 30 reps in place hops, fwd/bkwd hops, side to side hops      Knee/Hip Exercises: Standing   Functional Squat  2 sets;Other (comment);15 reps    Functional Squat Limitations  single leg squat to chair; Lt LE    Rebounder  2x 20 reps ball towass forward and 2x20 lateral toss, Lt SLS on black foam pad, 3000GR ball(blue)    Other Standing Knee Exercises  Tall Kneeling: Nordic hamstring and quad curl - 2x 10 reps each      Knee/Hip Exercises: Seated   Other Seated Knee/Hip Exercises  Stool Scoot: hamstring 2x 30' RT, LT LE only               PT Short Term Goals - 10/29/18 1720      PT SHORT TERM GOAL #1   Title  Patient will be independent  with HEP, updated PRN, toprogress safely through rehab protocol.     Time  1    Period  Weeks    Status  Achieved    Target Date  08/20/18      PT SHORT TERM GOAL #2   Title  Patient will achieve AROM 0-100 to progress safely to next phase of protocol for strengthening and mobility/ROM exercises.     Time  3    Period  Weeks    Status  Achieved    Target Date  09/03/18      PT SHORT TERM GOAL #3   Title  Patient will demonstrate normal gait pattern with no brace ro crutches to indicate good quad control and tolerance to FWB to improve mobility in daily activities and progress to next phase of protocol.     Time  4    Period  Weeks    Status  Achieved    Target Date  09/10/18        PT Long Term Goals - 10/29/18 1720      PT LONG TERM GOAL #1   Title  Patient will achieve 0-130 degrees on Lt knee AROM to equal Rt Knee mobility to imrpove fucntional giat and mobility.     Baseline  10/29/18 = 0-130    Time  6    Period  Weeks    Status  Achieved      PT  LONG TERM GOAL #2   Title  Patient will demosntrate 70% hamstring strength on Lt knee compared to Rt knee to indicate imrpovements in functional strength with 1 rep max test.     Baseline  pt remains limited (Rt = 132.5lbs, LT = 70lbs) Lt is at 52% or Rt    Time  4   additional   Period  Weeks    Status  On-going    Target Date  11/26/18      PT LONG TERM GOAL #3   Title  Patient will demosntrate 60% quadriceps strength on Lt knee compared to Rt knee to indicate imrpovements in functional strength with 1 rep max test.     Baseline  pt remains limited (Rt = 80lbs, LT = 30lbs) Lt is at 37.5% or Rt    Time  4   additional   Period  Weeks    Status  On-going    Target Date  11/26/18            Plan - 11/06/18 1231    Clinical Impression Statement  continued to focus on strengthening per ACL protocol.  Able to increase weight on hamstring and leg press machines and add additional set of most exercises this session.  Increased to heavier weighted balll with rebounder and increased resistance on isokinetic biodex.  Pt stll challenged with kneeling activity due to weakness.  No rest breaks neede this session.    Personal Factors and Comorbidities  Profession    Examination-Activity Limitations  Bathing;Stairs;Sit;Stand;Transfers    Examination-Participation Restrictions  Driving;Yard Work;Cleaning;Community Activity;Laundry;Other   work   Stability/Clinical Decision Making  Stable/Uncomplicated    Rehab Potential  Excellent    PT Frequency  2x / week   1x/week for 4, 2x/week for 4   PT Duration  8 weeks    PT Treatment/Interventions  ADLs/Self Care Home Management;Aquatic Therapy;Cryotherapy;Electrical Stimulation;Moist Heat;DME Instruction;Gait training;Stair training;Functional mobility training;Therapeutic activities;Therapeutic exercise;Balance training;Neuromuscular re-education;Patient/family education;Manual techniques;Scar mobilization;Passive range of motion;Taping    PT Next  Visit Plan  Pt currently  16 weeks post-op (11/05/18). [facility].  continue with primary focus on improving Lt quad/hams strength. Focus on quad and hamstring strengthening on Lt LE isolate (use biodex and cybex) Add bil and SL hopping drills next session. Follow up on new HEP.    PT Home Exercise Plan  Eval: quad set, heel slide, supine knee flexion stretch with rope, hip extension, hamsring curl, hip abduction, heel raises; 08/20/18 - patella mobilization, hamstring isometric, prone knee hang. 10/29/18 - Nordic hamstring and quad curls, bridge walkouts, [place] squats    Consulted and Agree with Plan of Care  Patient       Patient will benefit from skilled therapeutic intervention in order to improve the following deficits and impairments:  Abnormal gait, Decreased activity tolerance, Decreased endurance, Decreased range of motion, Decreased strength, Increased edema, Difficulty walking, Decreased mobility, Decreased balance, Increased fascial restricitons  Visit Diagnosis: 1. Acute pain of left knee   2. Muscle weakness (generalized)   3. Stiffness of left knee, not elsewhere classified        Problem List Patient Active Problem List   Diagnosis Date Noted  . CONTUSION OF FOREARM 03/17/2009   Lurena NidaAmy B Frazier, PTA/CLT [phone]  Lurena NidaFrazier, Amy B 11/06/2018, 12:36 PM  [place] [facility] [address] [place], [place], [address] Phone: [phone]   Fax:  [phone]  Name: Marveen ReeksJonathan R Arambula MRN: [id number] Date of Birth: 1995/10/19

## 2018-11-08 ENCOUNTER — Ambulatory Visit (HOSPITAL_COMMUNITY): Payer: BC Managed Care – PPO

## 2018-11-08 ENCOUNTER — Other Ambulatory Visit: Payer: Self-pay

## 2018-11-08 ENCOUNTER — Encounter (HOSPITAL_COMMUNITY): Payer: Self-pay

## 2018-11-08 DIAGNOSIS — M6281 Muscle weakness (generalized): Secondary | ICD-10-CM

## 2018-11-08 DIAGNOSIS — M25662 Stiffness of left knee, not elsewhere classified: Secondary | ICD-10-CM | POA: Diagnosis not present

## 2018-11-08 DIAGNOSIS — M25562 Pain in left knee: Secondary | ICD-10-CM

## 2018-11-08 NOTE — Therapy (Signed)
[place] [address] [place], [place], [address] Phone: [phone]   Fax:  [phone]  Physical Therapy Treatment  Patient Details  Name: Nathaniel [name] MRN: [id number] Date of Birth: [birth year] Referring Provider (PT): Doren Custard Robb Matar, [place]   Encounter Date: 11/08/2018  PT End of Session - 11/08/18 1003    Visit Number  21   20th visit PN complete #18   Number of Visits  28    Date for PT Re-Evaluation  11/26/18    Authorization Type  BCBS Other (no auth required, 60 visits per year PT/OT combined, $20 co-pay)    Authorization Time Period  08/13/18 - 10/08/18; 09/26/18-10/26/18; NEW : 10/29/18-11/26/18  20th visit PN completed #18    Authorization - Visit Number  21   5' on Elliptical, not included wiht charges   Authorization - Number of Visits  60    PT Start Time  1001    PT Stop Time  1052    PT Time Calculation (min)  51 min    Activity Tolerance  Patient tolerated treatment well    Behavior During Therapy  [facility] for tasks assessed/performed       History reviewed. No pertinent past medical history.  Past Surgical History:  Procedure Laterality Date  . ANTERIOR CRUCIATE LIGAMENT REPAIR Left 07/16/2018    There were no vitals filed for this visit.  Subjective Assessment - 11/08/18 0957    Subjective  Knee is sore today, pain scale 4/10.    Pertinent History  LT ACL Reconstruction hamstring autograft 07/16/18    Patient Stated Goals  to get back to work and the gym    Currently in Pain?  Yes    Pain Score  4     Pain Location  Knee    Pain Orientation  Left;Anterior    Pain Descriptors / Indicators  Sore    Pain Type  Chronic pain;Surgical pain    Pain Frequency  Intermittent    Aggravating Factors   unsure    Pain Relieving Factors  ice         [facility] PT Assessment - 11/08/18 0001      Assessment   Medical Diagnosis  Lt ACL Reconstruction with Hamstring Autograft    Referring Provider (PT)  Cline Crock, PA-C    Onset Date/Surgical Date  07/16/18    Next MD Visit  11/22/18    Prior Therapy  none      Precautions   Precautions  Other (comment);Knee    Precaution Comments  [facility] ACL Autograft Protocol                   [facility] Adult PT Treatment/Exercise - 11/08/18 0001      Knee/Hip Exercises: Stretches   Passive Hamstring Stretch  Left;2 reps;30 seconds;Limitations    Passive Hamstring Stretch Limitations  standing with 12" step      Knee/Hip Exercises: Aerobic   Elliptical  5 minutes level 8       Knee/Hip Exercises: Machines for Strengthening   Cybex Knee Extension  3x 12 reps, Lt LE, 20lbs    Cybex Knee Flexion  3x 12 reps Lt LE, 50lbs    Total Gym Leg Press  BodyCraft Leg Press: Lt LE only, 60lbs, 3x 12 reps    Other Machine  isokinetic biodex 3X10 each speed full ROM [id number]      Knee/Hip Exercises: Plyometrics   Other Plyometric Exercises  Jumping  drills Bills: 3x 30 reps in place hops, fwd/bkwd hops, side to side hops      Knee/Hip Exercises: Standing   Step Down  Left;2 sets;15 reps;Hand Hold: 0;Step Height: 6"   eccentric lowering   Functional Squat  2 sets;Other (comment);15 reps    Functional Squat Limitations  single leg squat to chair; Lt LE    Other Standing Knee Exercises  Tall Kneeling: Nordic hamstring and quad curl - 2x 10 reps each      Knee/Hip Exercises: Supine   Other Supine Knee/Hip Exercises  Isometric Single Leg Bridge with hamstrign slide/curl on contralateral LE: 3x 10 reps bil LE               PT Short Term Goals - 10/29/18 1720      PT SHORT TERM GOAL #1   Title  Patient will be independent with HEP, updated PRN, toprogress safely through rehab protocol.     Time  1    Period  Weeks    Status  Achieved    Target Date  08/20/18      PT SHORT TERM GOAL #2   Title  Patient will achieve AROM 0-100 to progress safely to next phase of protocol for strengthening and mobility/ROM  exercises.     Time  3    Period  Weeks    Status  Achieved    Target Date  09/03/18      PT SHORT TERM GOAL #3   Title  Patient will demonstrate normal gait pattern with no brace ro crutches to indicate good quad control and tolerance to FWB to improve mobility in daily activities and progress to next phase of protocol.     Time  4    Period  Weeks    Status  Achieved    Target Date  09/10/18        PT Long Term Goals - 10/29/18 1720      PT LONG TERM GOAL #1   Title  Patient will achieve 0-130 degrees on Lt knee AROM to equal Rt Knee mobility to imrpove fucntional giat and mobility.     Baseline  10/29/18 = 0-130    Time  6    Period  Weeks    Status  Achieved      PT LONG TERM GOAL #2   Title  Patient will demosntrate 70% hamstring strength on Lt knee compared to Rt knee to indicate imrpovements in functional strength with 1 rep max test.     Baseline  pt remains limited (Rt = 132.5lbs, LT = 70lbs) Lt is at 52% or Rt    Time  4   additional   Period  Weeks    Status  On-going    Target Date  11/26/18      PT LONG TERM GOAL #3   Title  Patient will demosntrate 60% quadriceps strength on Lt knee compared to Rt knee to indicate imrpovements in functional strength with 1 rep max test.     Baseline  pt remains limited (Rt = 80lbs, LT = 30lbs) Lt is at 37.5% or Rt    Time  4   additional   Period  Weeks    Status  On-going    Target Date  11/26/18            Plan - 11/08/18 1141    Clinical Impression Statement  Continued session focus on strengthening ACL protocol at week 16.  Pt continues to  demonstrate deficits with mechanics and weakness with advance hamstring and quadricep strenghtening exercises.  Verbal, visual wiht mirror and tactile cueing with chair for improved mechanics wiht squats and noted visual muscle fatigue with sliding bridges, SLS squats, and isokinetic biodex.  No rest breaks required this session and reports decreased pain at EOS.  Reviewed  compliance with HEP.    Personal Factors and Comorbidities  Profession    Examination-Activity Limitations  Bathing;Stairs;Sit;Stand;Transfers    Examination-Participation Restrictions  Driving;Yard Work;Cleaning;Community Activity;Laundry;Other    Stability/Clinical Decision Making  Stable/Uncomplicated    Clinical Decision Making  Low    Rehab Potential  Excellent    PT Frequency  2x / week   1x a week for 4 weeks then 2x for 4 weeks   PT Duration  8 weeks    PT Treatment/Interventions  ADLs/Self Care Home Management;Aquatic Therapy;Cryotherapy;Electrical Stimulation;Moist Heat;DME Instruction;Gait training;Stair training;Functional mobility training;Therapeutic activities;Therapeutic exercise;Balance training;Neuromuscular re-education;Patient/family education;Manual techniques;Scar mobilization;Passive range of motion;Taping    PT Next Visit Plan  Pt currently 16 weeks post-op (11/05/18). [facility].  continue with primary focus on improving Lt quad/hams strength. Focus on quad and hamstring strengthening on Lt LE isolate (use biodex and cybex) Add bil and SL hopping drills next session. Follow up on new HEP.    PT Home Exercise Plan  Eval: quad set, heel slide, supine knee flexion stretch with rope, hip extension, hamsring curl, hip abduction, heel raises; 08/20/18 - patella mobilization, hamstring isometric, prone knee hang. 10/29/18 - Nordic hamstring and quad curls, bridge walkouts, [place] squats       Patient will benefit from skilled therapeutic intervention in order to improve the following deficits and impairments:  Abnormal gait, Decreased activity tolerance, Decreased endurance, Decreased range of motion, Decreased strength, Increased edema, Difficulty walking, Decreased mobility, Decreased balance, Increased fascial restricitons  Visit Diagnosis: 1. Acute pain of left knee   2. Muscle weakness (generalized)   3. Stiffness of left knee, not  elsewhere classified        Problem List Patient Active Problem List   Diagnosis Date Noted  . CONTUSION OF FOREARM 03/17/2009   Becky Saxasey Whitnie Deleon, LPTA; CBIS [phone]  Juel BurrowCockerham, Kaavya Puskarich Jo 11/08/2018, 11:49 AM  [facility] [address] [place], [place], [address] Phone: [phone]   Fax:  [phone]  Name: Marveen ReeksJonathan R Conroy MRN: [id number] Date of Birth: [birth year]

## 2018-11-13 ENCOUNTER — Ambulatory Visit (HOSPITAL_COMMUNITY): Payer: BC Managed Care – PPO | Admitting: Physical Therapy

## 2018-11-13 ENCOUNTER — Encounter (HOSPITAL_COMMUNITY): Payer: Self-pay | Admitting: Physical Therapy

## 2018-11-13 ENCOUNTER — Other Ambulatory Visit: Payer: Self-pay

## 2018-11-13 DIAGNOSIS — M6281 Muscle weakness (generalized): Secondary | ICD-10-CM

## 2018-11-13 DIAGNOSIS — M25662 Stiffness of left knee, not elsewhere classified: Secondary | ICD-10-CM | POA: Diagnosis not present

## 2018-11-13 DIAGNOSIS — M25562 Pain in left knee: Secondary | ICD-10-CM

## 2018-11-13 NOTE — Therapy (Signed)
[place] [facility] [address] [place], [place], [address] Phone: [phone]   Fax:  [phone]  Physical Therapy Treatment  Patient Details  Name: Nathaniel ReeksJonathan R Azzara MRN: [id number] Date of Birth: [birth year] Referring Provider (PT): Durwin Noraixon, Katharina Caperhomas [place], [place]   Encounter Date: 11/13/2018  PT End of Session - 11/13/18 1050    Visit Number  22   20th visit PN complete #18   Number of Visits  28    Date for PT Re-Evaluation  11/26/18    Authorization Type  BC[employer] Other (no auth required, 60 visits per year PT/OT combined, $20 co-pay)    Authorization Time Period  08/13/18 - 10/08/18; 09/26/18-10/26/18; NEW : 10/29/18-11/26/18  20th visit PN completed #18    Authorization - Visit Number  22   5' on Elliptical, not included wiht charges   Authorization - Number of Visits  60    PT Start Time  0932    PT Stop Time  1020    PT Time Calculation (min)  48 min    Activity Tolerance  Patient tolerated treatment well    Behavior During Therapy  [facility] for tasks assessed/performed       History reviewed. No pertinent past medical history.  Past Surgical History:  Procedure Laterality Date  . ANTERIOR CRUCIATE LIGAMENT REPAIR Left 07/16/2018    There were no vitals filed for this visit.  Subjective Assessment - 11/13/18 0936    Subjective  No soreness or pain today.    Currently in Pain?  No/denies                       [facility] - 11/13/18 0001      Knee/Hip Exercises: Stretches   Passive Hamstring Stretch  Left;2 reps;30 seconds;Limitations    Passive Hamstring Stretch Limitations  standing with 12" step    Gastroc Stretch  Both;3 reps;30 seconds    Gastroc Stretch Limitations  Slant board      Knee/Hip Exercises: Aerobic   Elliptical  5 minutes level 8       Knee/Hip Exercises: Machines for Strengthening   Cybex Knee Extension  3x 15 reps, Lt LE, 20lbs    Cybex Knee Flexion  3x 15 reps Lt LE, 50lbs     Total Gym Leg Press  BodyCraft Leg Press: Lt LE only, 60lbs, 3x 12 reps    Other Machine  isokinetic biodex 3X10 each speed full ROM [id number]      Knee/Hip Exercises: Plyometrics   Other Plyometric Exercises  Jumping drills Bills: 3x 30 reps in place hops, fwd/bkwd hops, side to side hops      Knee/Hip Exercises: Standing   Step Down  Left;2 sets;15 reps;Hand Hold: 0;Step Height: 6"    Functional Squat  2 sets;Other (comment);15 reps    Functional Squat Limitations  single leg squat to chair; Lt LE    Other Standing Knee Exercises  Tall Kneeling: Nordic hamstring and quad curl - 2x 10 reps each      Knee/Hip Exercises: Supine   Other Supine Knee/Hip Exercises  Isometric Single Leg Bridge with hamstrign slide/curl on contralateral LE: 3x 10 reps bil LE               PT Short Term Goals - 10/29/18 1720      PT SHORT TERM GOAL #1   Title  Patient will be independent with HEP, updated PRN, toprogress safely through rehab protocol.  Time  1    Period  Weeks    Status  Achieved    Target Date  08/20/18      PT SHORT TERM GOAL #2   Title  Patient will achieve AROM 0-100 to progress safely to next phase of protocol for strengthening and mobility/ROM exercises.     Time  3    Period  Weeks    Status  Achieved    Target Date  09/03/18      PT SHORT TERM GOAL #3   Title  Patient will demonstrate normal gait pattern with no brace ro crutches to indicate good quad control and tolerance to FWB to improve mobility in daily activities and progress to next phase of protocol.     Time  4    Period  Weeks    Status  Achieved    Target Date  09/10/18        PT Long Term Goals - 10/29/18 1720      PT LONG TERM GOAL #1   Title  Patient will achieve 0-130 degrees on Lt knee AROM to equal Rt Knee mobility to imrpove fucntional giat and mobility.     Baseline  10/29/18 = 0-130    Time  6    Period  Weeks    Status  Achieved      PT LONG TERM GOAL #2   Title   Patient will demosntrate 70% hamstring strength on Lt knee compared to Rt knee to indicate imrpovements in functional strength with 1 rep max test.     Baseline  pt remains limited (Rt = 132.5lbs, LT = 70lbs) Lt is at 52% or Rt    Time  4   additional   Period  Weeks    Status  On-going    Target Date  11/26/18      PT LONG TERM GOAL #3   Title  Patient will demosntrate 60% quadriceps strength on Lt knee compared to Rt knee to indicate imrpovements in functional strength with 1 rep max test.     Baseline  pt remains limited (Rt = 80lbs, LT = 30lbs) Lt is at 37.5% or Rt    Time  4   additional   Period  Weeks    Status  On-going    Target Date  11/26/18            Plan - 11/13/18 1050    Clinical Impression Statement  Treatment began by Shan Levans, PT and completed by Roseanne Reno PTA.  Pt able to complete all activities today without pain or issues.  Reports squats are getting easier for him with tall kneeling activity being the most difficult.  Less fatigue noted today as compared to previous work outs.  Overall improving with upcoming MD appointment.  Instructed pateint to let therapist know to complete a Progress note.    Personal Factors and Comorbidities  Profession    Examination-Activity Limitations  Bathing;Stairs;Sit;Stand;Transfers    Examination-Participation Restrictions  Driving;Yard Work;Cleaning;Community Activity;Laundry;Other    Stability/Clinical Decision Making  Stable/Uncomplicated    Rehab Potential  Excellent    PT Frequency  2x / week   1x a week for 4 weeks then 2x for 4 weeks   PT Duration  8 weeks    PT Treatment/Interventions  ADLs/Self Care Home Management;Aquatic Therapy;Cryotherapy;Electrical Stimulation;Moist Heat;DME Instruction;Gait training;Stair training;Functional mobility training;Therapeutic activities;Therapeutic exercise;Balance training;Neuromuscular re-education;Patient/family education;Manual techniques;Scar mobilization;Passive range of  motion;Taping    PT Next Visit Plan  Pt  currently 17 weeks post-op (11/12/18). Follow [facility] orthopedic ACL Autograft protocol.  continue with primary focus on improving Lt quad/hams strength. Focus on quad and hamstring strengthening isolating Lt LE.  Increase squat challenge next session and begin single leg hopping.    PT Home Exercise Plan  Eval: quad set, heel slide, supine knee flexion stretch with rope, hip extension, hamsring curl, hip abduction, heel raises; 08/20/18 - patella mobilization, hamstring isometric, prone knee hang. 10/29/18 - Nordic hamstring and quad curls, bridge walkouts, [place] squats       Patient will benefit from skilled therapeutic intervention in order to improve the following deficits and impairments:  Abnormal gait, Decreased activity tolerance, Decreased endurance, Decreased range of motion, Decreased strength, Increased edema, Difficulty walking, Decreased mobility, Decreased balance, Increased fascial restricitons  Visit Diagnosis: 1. Acute pain of left knee   2. Muscle weakness (generalized)   3. Stiffness of left knee, not elsewhere classified        Problem List Patient Active Problem List   Diagnosis Date Noted  . CONTUSION OF FOREARM 03/17/2009   Lurena Nida B , PTA/CLT [phone]  Lurena Nida,  B 11/13/2018, 10:59 AM  [facility] [address] [place], [place], [address] Phone: [phone]   Fax:  [phone]  Name: Nathaniel ReeksJonathan R Vanorman MRN: [id number] Date of Birth: Jul 26, 1995

## 2018-11-15 ENCOUNTER — Telehealth (HOSPITAL_COMMUNITY): Payer: Self-pay | Admitting: *Deleted

## 2018-11-15 ENCOUNTER — Ambulatory Visit (HOSPITAL_COMMUNITY): Payer: BC Managed Care – PPO

## 2018-11-15 NOTE — Telephone Encounter (Signed)
11/15/18  pt called at 8:47 to cx but no reason was given

## 2018-11-19 ENCOUNTER — Encounter (HOSPITAL_COMMUNITY): Payer: BC Managed Care – PPO | Admitting: Physical Therapy

## 2018-11-20 ENCOUNTER — Ambulatory Visit (HOSPITAL_COMMUNITY): Payer: BC Managed Care – PPO | Attending: Physician Assistant | Admitting: Physical Therapy

## 2018-11-20 ENCOUNTER — Telehealth (HOSPITAL_COMMUNITY): Payer: Self-pay | Admitting: Physical Therapy

## 2018-11-20 DIAGNOSIS — M25662 Stiffness of left knee, not elsewhere classified: Secondary | ICD-10-CM | POA: Insufficient documentation

## 2018-11-20 DIAGNOSIS — M25562 Pain in left knee: Secondary | ICD-10-CM | POA: Insufficient documentation

## 2018-11-20 DIAGNOSIS — M6281 Muscle weakness (generalized): Secondary | ICD-10-CM | POA: Insufficient documentation

## 2018-11-20 NOTE — Telephone Encounter (Signed)
Pt did not show for appointment.  Called and left message regarding missed appointment and reminder of next appointment on Thursday at 8:15am.    Teena Irani, PTA/CLT [phone]

## 2018-11-22 ENCOUNTER — Other Ambulatory Visit: Payer: Self-pay

## 2018-11-22 ENCOUNTER — Ambulatory Visit (HOSPITAL_COMMUNITY): Payer: BC Managed Care – PPO

## 2018-11-22 ENCOUNTER — Encounter (HOSPITAL_COMMUNITY): Payer: Self-pay

## 2018-11-22 DIAGNOSIS — M6281 Muscle weakness (generalized): Secondary | ICD-10-CM

## 2018-11-22 DIAGNOSIS — M25562 Pain in left knee: Secondary | ICD-10-CM

## 2018-11-22 DIAGNOSIS — M25662 Stiffness of left knee, not elsewhere classified: Secondary | ICD-10-CM

## 2018-11-22 NOTE — Therapy (Signed)
[place] [facility] [address] [place], [place], [address] Phone: [phone]   Fax:  [phone]  Physical Therapy Treatment  Patient Details  Name: Marveen ReeksJonathan R Gundrum MRN: [id number] Date of Birth: [birth year] Referring Provider (PT): Durwin Noraixon, Katharina Caperhomas [place], [place]   Encounter Date: 11/22/2018  PT End of Session - 11/22/18 0823    Visit Number  23   20th visit progress noted on #18   Number of Visits  28    Date for PT Re-Evaluation  11/26/18    Authorization Type  [employer]BS Other (no auth required, 60 visits per year PT/OT combined, $20 co-pay)    Authorization Time Period  08/13/18 - 10/08/18; 09/26/18-10/26/18; NEW : 10/29/18-11/26/18  20th visit PN completed #18    Authorization - Visit Number  23    Authorization - Number of Visits  60    PT Start Time  0820   5' on Elliptical, not included with charges   PT Stop Time  0910    PT Time Calculation (min)  50 min    Activity Tolerance  Patient tolerated treatment well    Behavior During Therapy  [facility] for tasks assessed/performed       History reviewed. No pertinent past medical history.  Past Surgical History:  Procedure Laterality Date  . ANTERIOR CRUCIATE LIGAMENT REPAIR Left 07/16/2018    There were no vitals filed for this visit.  Subjective Assessment - 11/22/18 0822    Subjective  Went to MD Tuesday, doctor wishes to extend therapy for hamstring and quad strengthening.  No reports of pain currently.    Pertinent History  LT ACL Reconstruction hamstring autograft 07/16/18    Patient Stated Goals  to get back to work and the gym    Currently in Pain?  No/denies                       [facility] - 11/22/18 0001      Knee/Hip Exercises: Stretches   Passive Hamstring Stretch  Left;2 reps;30 seconds;Limitations    Passive Hamstring Stretch Limitations  standing with 12" step      Knee/Hip Exercises: Aerobic   Elliptical  5 minutes level 8        Knee/Hip Exercises: Machines for Strengthening   Cybex Knee Extension  3x 15 reps, Lt LE, 20lbs    Cybex Knee Flexion  3x 15 reps Lt LE, 50lbs    Total Gym Leg Press  BodyCraft Leg Press: Lt LE only, 60lbs, 3x 12 reps    Other Machine  isokinetic biodex 3X10 each speed full ROM [id number]      Knee/Hip Exercises: Standing   Functional Squat  2 sets;Other (comment);15 reps    Functional Squat Limitations  single leg squat to chair; Lt LE    Other Standing Knee Exercises  Tall Kneeling: Nordic hamstring and quad curl - 2x 10 reps each    Other Standing Knee Exercises  [place] squat 2x 10 with 10# BUE; Common lunge matrix (forward, side and behind) 10x each; RDL 50# 2x 10      Knee/Hip Exercises: Supine   Bridges  Both;1 set;15 reps    Bridges Limitations  bridge walkout    Other Supine Knee/Hip Exercises  Isometric Single Leg Bridge with hamstrign slide/curl on contralateral LE: 3x 10 reps bil LE               PT Short Term Goals - 10/29/18 1720  PT SHORT TERM GOAL #1   Title  Patient will be independent with HEP, updated PRN, toprogress safely through rehab protocol.     Time  1    Period  Weeks    Status  Achieved    Target Date  08/20/18      PT SHORT TERM GOAL #2   Title  Patient will achieve AROM 0-100 to progress safely to next phase of protocol for strengthening and mobility/ROM exercises.     Time  3    Period  Weeks    Status  Achieved    Target Date  09/03/18      PT SHORT TERM GOAL #3   Title  Patient will demonstrate normal gait pattern with no brace ro crutches to indicate good quad control and tolerance to FWB to improve mobility in daily activities and progress to next phase of protocol.     Time  4    Period  Weeks    Status  Achieved    Target Date  09/10/18        PT Long Term Goals - 10/29/18 1720      PT LONG TERM GOAL #1   Title  Patient will achieve 0-130 degrees on Lt knee AROM to equal Rt Knee mobility to imrpove fucntional  giat and mobility.     Baseline  10/29/18 = 0-130    Time  6    Period  Weeks    Status  Achieved      PT LONG TERM GOAL #2   Title  Patient will demosntrate 70% hamstring strength on Lt knee compared to Rt knee to indicate imrpovements in functional strength with 1 rep max test.     Baseline  pt remains limited (Rt = 132.5lbs, LT = 70lbs) Lt is at 52% or Rt    Time  4   additional   Period  Weeks    Status  On-going    Target Date  11/26/18      PT LONG TERM GOAL #3   Title  Patient will demosntrate 60% quadriceps strength on Lt knee compared to Rt knee to indicate imrpovements in functional strength with 1 rep max test.     Baseline  pt remains limited (Rt = 80lbs, LT = 30lbs) Lt is at 37.5% or Rt    Time  4   additional   Period  Weeks    Status  On-going    Target Date  11/26/18            Plan - 11/22/18 0854    Clinical Impression Statement  Received order to continue PT 2-3 times/per week for 2 weeks to continue strengthening, instructed no running.  Pt at 18 weeks post-op, continued with current POC for strengthening.  Pt continues to have difficulty with equal weight bearing and decreased ROM wiht single leg squats.  Added lunge matrix in common direction to assist with single leg stance with most difficulty going forward.  Noted visual muscle fatigue with task.  Pt able to complete all exercises with no reports of pain, main cue to slow and and improve control wiht task.    Personal Factors and Comorbidities  Profession    Examination-Activity Limitations  Bathing;Stairs;Sit;Stand;Transfers    Examination-Participation Restrictions  Driving;Yard Work;Cleaning;Community Activity;Laundry;Other    Stability/Clinical Decision Making  Stable/Uncomplicated    Clinical Decision Making  Low    Rehab Potential  Excellent    PT Frequency  2x / week  1x a week for 4 weeks then 2x for 4 weeks\   PT Duration  8 weeks    PT Treatment/Interventions  ADLs/Self Care Home  Management;Aquatic Therapy;Cryotherapy;Electrical Stimulation;Moist Heat;DME Instruction;Gait training;Stair training;Functional mobility training;Therapeutic activities;Therapeutic exercise;Balance training;Neuromuscular re-education;Patient/family education;Manual techniques;Scar mobilization;Passive range of motion;Taping    PT Next Visit Plan  Next session reassessment, do 1 rep max.  Pt currently 18 weeks post-op (7/6//20). Continue lunge matrix and progress single leg squats. Follow [facility] orthopedic ACL Autograft protocol.  continue with primary focus on improving Lt quad/hams strength. Focus on quad and hamstring strengthening isolating Lt LE.  Increase squat challenge next session and begin single leg hopping.    PT Home Exercise Plan  Eval: quad set, heel slide, supine knee flexion stretch with rope, hip extension, hamsring curl, hip abduction, heel raises; 08/20/18 - patella mobilization, hamstring isometric, prone knee hang. 10/29/18 - Nordic hamstring and quad curls, bridge walkouts, [place] squats       Patient will benefit from skilled therapeutic intervention in order to improve the following deficits and impairments:  Abnormal gait, Decreased activity tolerance, Decreased endurance, Decreased range of motion, Decreased strength, Increased edema, Difficulty walking, Decreased mobility, Decreased balance, Increased fascial restricitons  Visit Diagnosis: 1. Stiffness of left knee, not elsewhere classified   2. Acute pain of left knee   3. Muscle weakness (generalized)        Problem List Patient Active Problem List   Diagnosis Date Noted  . CONTUSION OF FOREARM 03/17/2009   Ihor Austin, LPTA; [place]  Aldona Lento 11/22/2018, 1:24 PM  [place] [address] [place], [place], [address] Phone: [phone]   Fax:  [phone]  Name: IKE MARAGH MRN: [id number] Date of Birth: 1995-11-13

## 2018-11-26 ENCOUNTER — Other Ambulatory Visit: Payer: Self-pay

## 2018-11-26 ENCOUNTER — Encounter (HOSPITAL_COMMUNITY): Payer: Self-pay

## 2018-11-26 ENCOUNTER — Ambulatory Visit (HOSPITAL_COMMUNITY): Payer: BC Managed Care – PPO

## 2018-11-26 DIAGNOSIS — M25562 Pain in left knee: Secondary | ICD-10-CM | POA: Diagnosis not present

## 2018-11-26 DIAGNOSIS — M25662 Stiffness of left knee, not elsewhere classified: Secondary | ICD-10-CM

## 2018-11-26 DIAGNOSIS — M6281 Muscle weakness (generalized): Secondary | ICD-10-CM

## 2018-11-26 NOTE — Therapy (Signed)
[facility] [address] [place], [place], [address] Phone: [phone]   Fax:  [phone]  Physical Therapy Treatment  Patient Details  Name: Nathaniel [name] MRN: [id number] Date of Birth: 1995/11/24 Referring Provider (PT): Durwin Noraixon, Katharina Caperhomas Bradley, [place]   Encounter Date: 11/26/2018  PT End of Session - 11/26/18 1017    Visit Number  24   20th visit progress noted on #18   Number of Visits  36    Date for PT Re-Evaluation  12/24/18    Authorization Type  BC[employer] Other (no auth required, 60 visits per year PT/OT combined, $20 co-pay)    Authorization Time Period  08/13/18 - 10/08/18; 09/26/18-10/26/18; 10/29/18-11/26/18; NEW: 11/26/18-12/24/18    Authorization - Visit Number  24    Authorization - Number of Visits  60    PT Start Time  0919    PT Stop Time  1004    PT Time Calculation (min)  45 min    Equipment Utilized During Treatment  Gait belt    Activity Tolerance  Patient tolerated treatment well    Behavior During Therapy  [facility] for tasks assessed/performed       History reviewed. No pertinent past medical history.  Past Surgical History:  Procedure Laterality Date  . ANTERIOR CRUCIATE LIGAMENT REPAIR Left 07/16/2018    There were no vitals filed for this visit.  Subjective Assessment - 11/26/18 0921    Subjective  Patient feels good today. He denies pain in his knee and is still not cleared to go back to work.    Pertinent History  LT ACL Reconstruction hamstring autograft 07/16/18    Limitations  House hold activities;Standing;Walking    Patient Stated Goals  to get back to work and the gym    Currently in Pain?  No/denies       [facility] - 11/26/18 0001      Assessment   Medical Diagnosis  Lt ACL Reconstruction with Hamstring Autograft    Referring Provider (PT)  Colvin Caroliixon, Thomas Bradley, PA-C    Onset Date/Surgical Date  07/16/18    Next MD Visit  11/22/18    Prior Therapy  none      Precautions   Precautions   Other (comment);Knee    Precaution Comments   Orthopedics ACL Autograft Protocol      ROM / Strength   AROM / PROM / Strength  AROM;Strength      AROM   Left Knee Extension  0    Left Knee Flexion  130      Strength   Overall Strength Comments  1 Rep Max Testing: (Quadriceps: Rt = 80lbs, Lt = 40lbs) (Hamstring: Rt = 145lbs, Lt = 95lbs)        [facility] Adult PT Treatment/Exercise - 11/26/18 0001      Knee/Hip Exercises: Aerobic   Elliptical  5 minutes level 8       Knee/Hip Exercises: Machines for Strengthening   Total Gym Leg Press  BodyCraft Leg Press: Lt LE only, 80lbs, 3x 12 reps; 3x 15 reps single leg heel raise, 60lbs up to 80lbs on 3rd set    Other Machine  isokinetic biodex 3X10 each speed full ROM 105-75-45-45-75-105      Knee/Hip Exercises: Plyometrics   Other Plyometric Exercises  Jumping drills Bil LE's: 3x 30 reps in place hops, fwd/bkwd hops, side to side hops      Knee/Hip Exercises: Standing   Forward Step Up  Both;3 sets;10  reps;Limitations    Forward Step Up Limitations  12" box, SLS step up, 10lbs bil UE    Functional Squat  3 sets;Limitations    Functional Squat Limitations  Bulgarian squat, 16" box, 3x 12 with 10# BUE;    Other Standing Knee Exercises  2x15 reps: 3 way squat with fwd/lat.bkwd slide        PT Education - 11/26/18 1002    Education Details  Educated patient on improvements in strength with 1 rep max testing. Educated on readiness to begin weigthlifting at gym for quad and hamstring stretngthening. Educated pt to begin with 2x/week and that he can gradually increase to 3x/week if he does not have pain increase.    Person(s) Educated  Patient    Methods  Explanation;Handout    Comprehension  Verbalized understanding;Returned demonstration       PT Short Term Goals - 10/29/18 1720      PT SHORT TERM GOAL #1   Title  Patient will be independent with HEP, updated PRN, toprogress safely through rehab protocol.     Time  1    Period   Weeks    Status  Achieved    Target Date  08/20/18      PT SHORT TERM GOAL #2   Title  Patient will achieve AROM 0-100 to progress safely to next phase of protocol for strengthening and mobility/ROM exercises.     Time  3    Period  Weeks    Status  Achieved    Target Date  09/03/18      PT SHORT TERM GOAL #3   Title  Patient will demonstrate normal gait pattern with no brace ro crutches to indicate good quad control and tolerance to FWB to improve mobility in daily activities and progress to next phase of protocol.     Time  4    Period  Weeks    Status  Achieved    Target Date  09/10/18        PT Long Term Goals - 11/26/18 1027      PT LONG TERM GOAL #1   Title  Patient will achieve 0-130 degrees on Lt knee AROM to equal Rt Knee mobility to imrpove fucntional giat and mobility.     Baseline  10/29/18 = 0-130    Time  6    Period  Weeks    Status  Achieved      PT LONG TERM GOAL #2   Title  Patient will demosntrate 70% hamstring strength on Lt knee compared to Rt knee to indicate imrpovements in functional strength with 1 rep max test.     Baseline  pt remains limited (Rt = 145 lbs, LT = 95lbs) Lt is at 65% of Rt    Time  4   additional   Period  Weeks    Status  On-going      PT LONG TERM GOAL #3   Title  Patient will demosntrate 60% quadriceps strength on Lt knee compared to Rt knee to indicate imrpovements in functional strength with 1 rep max test.     Baseline  pt remains limited (Rt = 80lbs, LT = 40lbs) Lt is at 50 % or Rt    Time  4   additional   Period  Weeks    Status  On-going        Plan - 11/26/18 1017    Clinical Impression Statement  Re-assessed patient's one-rep max strength test today and  Lt quadriceps is functioning at 50% of Rt LE and Lt hamstrings is functioning at 65% of Rt LE which is a significant increase in strength from last re-assessment. Attempted to initiate single leg jumping drills this session, however pt unable to due to pain in Lt  knee, continued with bil LE jumping. Continued with isolated strengthening for quad, hamstring, and gastroc of Lt LE and discussed appropriate exercises for pt to perform at gym. He remains limited by weakness and pain in SL plymometrics activities and will continue to benefit from skilled PT interventions to address impairments to return to PLOF.    Personal Factors and Comorbidities  Profession    Examination-Activity Limitations  Bathing;Stairs;Sit;Stand;Transfers    Examination-Participation Restrictions  Driving;Yard Work;Cleaning;Community Activity;Laundry;Other    Stability/Clinical Decision Making  Stable/Uncomplicated    Rehab Potential  Excellent    PT Frequency  2x / week    PT Duration  4 weeks   additional   PT Treatment/Interventions  ADLs/Self Care Home Management;Aquatic Therapy;Cryotherapy;Electrical Stimulation;Moist Heat;DME Instruction;Gait training;Stair training;Functional mobility training;Therapeutic activities;Therapeutic exercise;Balance training;Neuromuscular re-education;Patient/family education;Manual techniques;Scar mobilization;Passive range of motion;Taping    PT Next Visit Plan  Pt currently 19 weeks post-op (7/6//20).  Continue lunge matrix and progress single leg squats. Follow [facility] orthopedic ACL Autograft protocol. Focus on quad and hamstring strengthening isolating Lt LE.  Increase squat challenge next session and begin single leg hopping. Provide gym routine for strengthening.    PT Home Exercise Plan  Eval: quad set, heel slide, supine knee flexion stretch with rope, hip extension, hamsring curl, hip abduction, heel raises; 08/20/18 - patella mobilization, hamstring isometric, prone knee hang. 10/29/18 - Nordic hamstring and quad curls, bridge walkouts, [place] squats    Consulted and Agree with Plan of Care  Patient       Patient will benefit from skilled therapeutic intervention in order to improve the following deficits and impairments:  Abnormal gait,  Decreased activity tolerance, Decreased endurance, Decreased range of motion, Decreased strength, Increased edema, Difficulty walking, Decreased mobility, Decreased balance, Increased fascial restricitons  Visit Diagnosis: 1. Stiffness of left knee, not elsewhere classified   2. Acute pain of left knee   3. Muscle weakness (generalized)        Problem List Patient Active Problem List   Diagnosis Date Noted  . CONTUSION OF FOREARM 03/17/2009    Valentino Saxonachel Quinn-Brown, PT, DPT, [facility] Physical Therapist with [facility]  11/26/2018 10:18 AM    [facility] [address] [place], [place], [address] Phone: [phone]   Fax:  [phone]  Name: Nathaniel ReeksJonathan R [name] MRN: [id number] Date of Birth: 1995-11-24

## 2018-11-28 ENCOUNTER — Encounter (HOSPITAL_COMMUNITY): Payer: Self-pay

## 2018-11-28 ENCOUNTER — Other Ambulatory Visit: Payer: Self-pay

## 2018-11-28 ENCOUNTER — Ambulatory Visit (HOSPITAL_COMMUNITY): Payer: BC Managed Care – PPO

## 2018-11-28 DIAGNOSIS — M25562 Pain in left knee: Secondary | ICD-10-CM

## 2018-11-28 DIAGNOSIS — M6281 Muscle weakness (generalized): Secondary | ICD-10-CM

## 2018-11-28 DIAGNOSIS — M25662 Stiffness of left knee, not elsewhere classified: Secondary | ICD-10-CM

## 2018-11-28 NOTE — Therapy (Addendum)
[facility] [address] [place], [place], [address] Phone: [phone]   Fax:  [phone]  Physical Therapy Treatment  Patient Details  Name: Nathaniel [name] MRN: [id number] Date of Birth: [birth year] Referring Provider (PT): Durwin Noraixon, Katharina Caperhomas Bradley, [place]   Encounter Date: 11/28/2018  PT End of Session - 11/28/18 0819    Visit Number  25   20th visit progress note complete on visit #18   Number of Visits  36    Date for PT Re-Evaluation  12/24/18    Authorization Type  BCBS Other (no auth required, 60 visits per year PT/OT combined, $20 co-pay)    Authorization Time Period  08/13/18 - 10/08/18; 09/26/18-10/26/18; 10/29/18-11/26/18; NEW: 11/26/18-12/24/18    Authorization - Visit Number  25    Authorization - Number of Visits  60    PT Start Time  [phone]   5' on elliptical, not included wiht charges   PT Stop Time  0900    PT Time Calculation (min)  44 min    Equipment Utilized During Treatment  --    Activity Tolerance  Patient tolerated treatment well    Behavior During Therapy  [facility] for tasks assessed/performed       History reviewed. No pertinent past medical history.  Past Surgical History:  Procedure Laterality Date  . ANTERIOR CRUCIATE LIGAMENT REPAIR Left 07/16/2018    There were no vitals filed for this visit.  Subjective Assessment - 11/28/18 0814    Subjective  Pt stated he feels good today, no reports of pain.  Has began latest HEP.  Reports he returned to [employer] gym yesterday.    Pertinent History  LT ACL Reconstruction hamstring autograft 07/16/18    Patient Stated Goals  to get back to work and the gym    Currently in Pain?  No/denies                       [facility] Treatment/Exercise - 11/28/18 0001      Knee/Hip Exercises: Aerobic   Elliptical  5 minutes level 8       Knee/Hip Exercises: Machines for Strengthening   Cybex Knee Extension  3x 12 reps, Lt LE, 20lbs    Cybex Knee Flexion   3X 12reps Lt LE only 49.5#   3X 12reps Lt LE only 49.5#   Cybex Leg Press  6 PL 3x 12 (bil LE)    Other Machine  isokinetic biodex 3X10 each speed full ROM [id number]      Knee/Hip Exercises: Plyometrics   Other Plyometric Exercises  Jumping drills Bil LE's: 3x 30 reps in place hops, fwd/bkwd hops, side to side hops      Knee/Hip Exercises: Standing   Functional Squat  3 sets;Limitations    Functional Squat Limitations  Bulgarian squat, 16" box, 3x 12 with 10# BUE;    Wall Squat  5 reps;5 seconds   SLS   Other Standing Knee Exercises  Tall Kneeling: Nordic hamstring and quad curl - 2x 10 reps each    Other Standing Knee Exercises  2x15 reps: 3 way squat with fwd/lat.bkwd slide               PT Short Term Goals - 10/29/18 1720      PT SHORT TERM GOAL #1   Title  Patient will be independent with HEP, updated PRN, toprogress safely through rehab protocol.     Time  1  Period  Weeks    Status  Achieved    Target Date  08/20/18      PT SHORT TERM GOAL #2   Title  Patient will achieve AROM 0-100 to progress safely to next phase of protocol for strengthening and mobility/ROM exercises.     Time  3    Period  Weeks    Status  Achieved    Target Date  09/03/18      PT SHORT TERM GOAL #3   Title  Patient will demonstrate normal gait pattern with no brace ro crutches to indicate good quad control and tolerance to FWB to improve mobility in daily activities and progress to next phase of protocol.     Time  4    Period  Weeks    Status  Achieved    Target Date  09/10/18        PT Long Term Goals - 11/26/18 1027      PT LONG TERM GOAL #1   Title  Patient will achieve 0-130 degrees on Lt knee AROM to equal Rt Knee mobility to imrpove fucntional giat and mobility.     Baseline  10/29/18 = 0-130    Time  6    Period  Weeks    Status  Achieved      PT LONG TERM GOAL #2   Title  Patient will demosntrate 70% hamstring strength on Lt knee compared to Rt knee to  indicate imrpovements in functional strength with 1 rep max test.     Baseline  pt remains limited (Rt = 145 lbs, LT = 95lbs) Lt is at 65% of Rt    Time  4   additional   Period  Weeks    Status  On-going      PT LONG TERM GOAL #3   Title  Patient will demosntrate 60% quadriceps strength on Lt knee compared to Rt knee to indicate imrpovements in functional strength with 1 rep max test.     Baseline  pt remains limited (Rt = 80lbs, LT = 40lbs) Lt is at 50 % or Rt    Time  4   additional   Period  Weeks    Status  On-going            Plan - 11/28/18 0905    Clinical Impression Statement  Continued with established POC for functional strengthening primarly quad, hamstring and gastroc musculature.  Attempted SLS wall squats for isolated strengthening of quads, pt with inability to complete with proper form due to quad weakness.  Noted visible muscle fatigue, no reports of pain through session.    Personal Factors and Comorbidities  Profession    Examination-Activity Limitations  Bathing;Stairs;Sit;Stand;Transfers    Examination-Participation Restrictions  Driving;Yard Work;Cleaning;Community Activity;Laundry;Other    Stability/Clinical Decision Making  Stable/Uncomplicated    Clinical Decision Making  Low    Rehab Potential  Excellent    PT Frequency  2x / week    PT Duration  4 weeks   additional   PT Treatment/Interventions  ADLs/Self Care Home Management;Aquatic Therapy;Cryotherapy;Electrical Stimulation;Moist Heat;DME Instruction;Gait training;Stair training;Functional mobility training;Therapeutic activities;Therapeutic exercise;Balance training;Neuromuscular re-education;Patient/family education;Manual techniques;Scar mobilization;Passive range of motion;Taping    PT Next Visit Plan  Pt currently 19 weeks post-op (7/6//20).  Continue lunge matrix and progress single leg squats. Follow [facility] orthopedic ACL Autograft protocol. Focus on quad and hamstring strengthening isolating  Lt LE.  Increase squat challenge next session and begin single leg hopping. Provide gym routine for  strengthening.    PT Home Exercise Plan  Eval: quad set, heel slide, supine knee flexion stretch with rope, hip extension, hamsring curl, hip abduction, heel raises; 08/20/18 - patella mobilization, hamstring isometric, prone knee hang. 10/29/18 - Nordic hamstring and quad curls, bridge walkouts, [place] squats       Patient will benefit from skilled therapeutic intervention in order to improve the following deficits and impairments:  Abnormal gait, Decreased activity tolerance, Decreased endurance, Decreased range of motion, Decreased strength, Increased edema, Difficulty walking, Decreased mobility, Decreased balance, Increased fascial restricitons  Visit Diagnosis: 1. Muscle weakness (generalized)   2. Acute pain of left knee   3. Stiffness of left knee, not elsewhere classified        Problem List Patient Active Problem List   Diagnosis Date Noted  . CONTUSION OF FOREARM 03/17/2009   Becky Saxasey , LPTA; CBIS [phone]  Juel Burrow,  Jo 11/28/2018, 10:24 AM  [place] [facility] [address] [place], [place], [address] Phone: [phone]   Fax:  [phone]  Name: Nathaniel ReeksJonathan R [name] MRN: [id number] Date of Birth: 1995/07/21

## 2018-12-06 ENCOUNTER — Encounter (HOSPITAL_COMMUNITY): Payer: Self-pay

## 2018-12-06 ENCOUNTER — Ambulatory Visit (HOSPITAL_COMMUNITY): Payer: BC Managed Care – PPO

## 2018-12-06 ENCOUNTER — Other Ambulatory Visit: Payer: Self-pay

## 2018-12-06 DIAGNOSIS — M25662 Stiffness of left knee, not elsewhere classified: Secondary | ICD-10-CM

## 2018-12-06 DIAGNOSIS — M25562 Pain in left knee: Secondary | ICD-10-CM

## 2018-12-06 DIAGNOSIS — M6281 Muscle weakness (generalized): Secondary | ICD-10-CM

## 2018-12-06 NOTE — Therapy (Signed)
[facility] [address] [place], [place], [address] Phone: [phone]   Fax:  [phone]  Physical Therapy Treatment  Patient Details  Name: Nathaniel ReeksJonathan R Hank MRN: [id number] Date of Birth: 12-Aug-1995 Referring Provider (PT): Durwin Noraixon, Katharina Caperhomas [place], [place]   Encounter Date: 12/06/2018  PT End of Session - 12/06/18 0825    Visit Number  26   20th visit progress note complete on visit #18   Number of Visits  36    Date for PT Re-Evaluation  12/24/18    Authorization Type  [employer] Other (no auth required, 60 visits per year PT/OT combined, $20 co-pay)    Authorization Time Period  08/13/18 - 10/08/18; 09/26/18-10/26/18; 10/29/18-11/26/18; NEW: 11/26/18-12/24/18    Authorization - Visit Number  26    Authorization - Number of Visits  60    PT Start Time  0827    PT Stop Time  0915    PT Time Calculation (min)  48 min    Activity Tolerance  Patient tolerated treatment well    Behavior During Therapy  [facility] tasks assessed/performed       History reviewed. No pertinent past medical history.  Past Surgical History:  Procedure Laterality Date  . ANTERIOR CRUCIATE LIGAMENT REPAIR Left 07/16/2018    There were no vitals filed for this visit.  Subjective Assessment - 12/06/18 0824    Subjective  Pt reports feeling good today. Pt reports the gym has opened back up and he has been going. Pt reports work wants him to return ASAP, but he hasn't been cleared from MD yet.    Pertinent History  LT ACL Reconstruction hamstring autograft 07/16/18    Patient Stated Goals  to get back to work and the gym    Currently in Pain?  No/denies         [facility]/Exercise - 12/06/18 0001      Knee/Hip Exercises: Stretches   Gastroc Stretch  Both;3 reps;30 seconds    Gastroc Stretch Limitations  slant board    Soleus Stretch  Left;30 seconds      Knee/Hip Exercises: Aerobic   Elliptical  5 minutes level 8       Knee/Hip Exercises: Machines  for Strengthening   Cybex Knee Extension  LLE, 20#, 3x12 reps    Total Gym Leg Press  BodyCraft Leg Press: Lt LE only, 60lbs, 3x12 reps      Knee/Hip Exercises: Plyometrics   Other Plyometric Exercises  Jumping drills BLE: in place, fwd/bwd, lateral, 3x30 reps each    Other Plyometric Exercises  Single leg lateral jumps, 2x10 reps      Knee/Hip Exercises: Standing   Functional Squat Limitations  [place] squat, 16" box, 20#, 3x8reps    Other Standing Knee Exercises  Heavy box-ups 20#, 18" step, 3x8 reps; single leg RDL with 50# cable row, 3x8 reps each side    Other Standing Knee Exercises  DB one arm snatch, 10#, 2x8 reps each; heel taps, L SLS, x10 reps from 4" box and 2x10 reps 6" box          PT Education - 12/06/18 0824    Education Details  Extensive HEP for gym, exercise technique    Person(s) Educated  Patient    Methods  Explanation;Demonstration;Handout    Comprehension  Verbalized understanding;Returned demonstration       PT Short Term Goals - 10/29/18 1720      PT SHORT TERM GOAL #1   Title  Patient will be independent with HEP, updated PRN, toprogress safely through rehab protocol.     Time  1    Period  Weeks    Status  Achieved    Target Date  08/20/18      PT SHORT TERM GOAL #2   Title  Patient will achieve AROM 0-100 to progress safely to next phase of protocol for strengthening and mobility/ROM exercises.     Time  3    Period  Weeks    Status  Achieved    Target Date  09/03/18      PT SHORT TERM GOAL #3   Title  Patient will demonstrate normal gait pattern with no brace ro crutches to indicate good quad control and tolerance to FWB to improve mobility in daily activities and progress to next phase of protocol.     Time  4    Period  Weeks    Status  Achieved    Target Date  09/10/18        PT Long Term Goals - 11/26/18 1027      PT LONG TERM GOAL #1   Title  Patient will achieve 0-130 degrees on Lt knee AROM to equal Rt Knee mobility to  imrpove fucntional giat and mobility.     Baseline  10/29/18 = 0-130    Time  6    Period  Weeks    Status  Achieved      PT LONG TERM GOAL #2   Title  Patient will demosntrate 70% hamstring strength on Lt knee compared to Rt knee to indicate imrpovements in functional strength with 1 rep max test.     Baseline  pt remains limited (Rt = 145 lbs, LT = 95lbs) Lt is at 65% of Rt    Time  4   additional   Period  Weeks    Status  On-going      PT LONG TERM GOAL #3   Title  Patient will demosntrate 60% quadriceps strength on Lt knee compared to Rt knee to indicate imrpovements in functional strength with 1 rep max test.     Baseline  pt remains limited (Rt = 80lbs, LT = 40lbs) Lt is at 50 % or Rt    Time  4   additional   Period  Weeks    Status  On-going            Plan - 12/06/18 0825    Clinical Impression Statement  Continued with pt's established POC with focus on establishing gym routine and progressing single limb activity this visit. Continued with LE strengthening this date adding L SLS heel taps focusing on L quad and HS activation to stabilize L leg standing while R leg taps floor and returns to 4-6" box. Pt demonstrates shaking in L SLS, but denies pain and able to complete without compromising form into L knee valgus. Pt continues to have deficits with machine weighted L single leg knee extension and flexion exercises compared to R single leg performance, educated to perform with slow controlled motion to avoid momentum. Pt noted to have L ankle decreased AROM with functional tasks, so added gastroc stretch and soleus stretch on slant board. Pt performs R single leg calf raise to higher position compared to L single leg calf raise and self reports tightness and limited feeling in L calf with exercises. Pt continues to report pain and difficulty with L single leg hops in place, but pain-free with lateral single  leg hops R<>L, no valgus noted, and good form. Issued extensive gym  routine this date and pt confirms confidence in performing independently. Pt able to perform Continue to progress as able.    Personal Factors and Comorbidities  Profession    Examination-Activity Limitations  Bathing;Stairs;Sit;Stand;Transfers    Examination-Participation Restrictions  Driving;Yard Work;Cleaning;Community Activity;Laundry;Other    Stability/Clinical Decision Making  Stable/Uncomplicated    Rehab Potential  Excellent    PT Frequency  2x / week    PT Duration  4 weeks   additional   PT Treatment/Interventions  ADLs/Self Care Home Management;Aquatic Therapy;Cryotherapy;Electrical Stimulation;Moist Heat;DME Instruction;Gait training;Stair training;Functional mobility training;Therapeutic activities;Therapeutic exercise;Balance training;Neuromuscular re-education;Patient/family education;Manual techniques;Scar mobilization;Passive range of motion;Taping    PT Next Visit Plan  Pt currently 21 weeks post-op (12/06/18). Continue L calf stretching and address L ankle limitations with functional strengthening if noted. Continue single leg hops within pain-free range. Continue lunge matrix and progress single leg squats. [facility]. Focus on quad and hamstring strengthening isolating Lt LE.    PT Home Exercise Plan  Eval: quad set, heel slide, supine knee flexion stretch with rope, hip extension, hamsring curl, hip abduction, heel raises; 08/20/18 - patella mobilization, hamstring isometric, prone knee hang. 10/29/18 - Nordic hamstring and quad curls, bridge walkouts, [place] squats; 7/23: Gym HEP (Heavy box ups, single leg RDL with cable row, [place] squat, swiss ball HS curl, Bosu get down-ups, split jump squats, bridge walk out, DB one arm snatch, single leg press, single leg extension, Nordic HS curl) and L calf stretch for gastroc and soleus    Consulted and Agree with Plan of Care  Patient       Patient will benefit from skilled therapeutic  intervention in order to improve the following deficits and impairments:  Abnormal gait, Decreased activity tolerance, Decreased endurance, Decreased range of motion, Decreased strength, Increased edema, Difficulty walking, Decreased mobility, Decreased balance, Increased fascial restricitons  Visit Diagnosis: 1. Stiffness of left knee, not elsewhere classified   2. Acute pain of left knee   3. Muscle weakness (generalized)        Problem List Patient Active Problem List   Diagnosis Date Noted  . CONTUSION OF FOREARM 03/17/2009       Domenick Bookbinderori Versie Fleener PT, DPT  [facility] [address] [place], [place], [address] Phone: [phone]   Fax:  [phone]  Name: Nathaniel ReeksJonathan R Scoles MRN: [id number] Date of Birth: [birth year]

## 2018-12-07 ENCOUNTER — Ambulatory Visit (HOSPITAL_COMMUNITY): Payer: BC Managed Care – PPO

## 2018-12-07 ENCOUNTER — Encounter

## 2018-12-10 ENCOUNTER — Ambulatory Visit (HOSPITAL_COMMUNITY): Payer: BC Managed Care – PPO | Admitting: Physical Therapy

## 2018-12-10 ENCOUNTER — Encounter (HOSPITAL_COMMUNITY): Payer: Self-pay | Admitting: Physical Therapy

## 2018-12-10 ENCOUNTER — Other Ambulatory Visit: Payer: Self-pay

## 2018-12-10 DIAGNOSIS — M25562 Pain in left knee: Secondary | ICD-10-CM

## 2018-12-10 DIAGNOSIS — M25662 Stiffness of left knee, not elsewhere classified: Secondary | ICD-10-CM

## 2018-12-10 DIAGNOSIS — M6281 Muscle weakness (generalized): Secondary | ICD-10-CM

## 2018-12-10 NOTE — Therapy (Signed)
[facility] [address] [place], [place], [address] Phone: [phone]   Fax:  [phone]  Physical Therapy Treatment  Patient Details  Name: Nathaniel [name] MRN: [id number] Date of Birth: 07-23-1995 Referring Provider (PT): Durwin Noraixon, Katharina Caperhomas [place], [place]   Encounter Date: 12/10/2018  PT End of Session - 12/10/18 1209    Visit Number  27   20th visit progress note complete on visit #18   Number of Visits  36    Date for PT Re-Evaluation  12/24/18    Authorization Type  [employer] Other (no auth required, 60 visits per year PT/OT combined, $20 co-pay)    Authorization Time Period  08/13/18 - 10/08/18; 09/26/18-10/26/18; 10/29/18-11/26/18; NEW: 11/26/18-12/24/18    Authorization - Visit Number  27    Authorization - Number of Visits  60    PT Start Time  1138    PT Stop Time  1222    PT Time Calculation (min)  44 min    Activity Tolerance  Patient tolerated treatment well    Behavior During Therapy  [facility] for tasks assessed/performed       History reviewed. No pertinent past medical history.  Past Surgical History:  Procedure Laterality Date  . ANTERIOR CRUCIATE LIGAMENT REPAIR Left 07/16/2018    There were no vitals filed for this visit.  Subjective Assessment - 12/10/18 1148    Subjective  pt reports no issues.  STates he is unsure as to when he returns to MD; they are trying to scedule him a date.  Reports work wants him back asap but hasn't been cleared from MD yet.    Currently in Pain?  No/denies                       [facility]/Exercise - 12/10/18 0001      Knee/Hip Exercises: Stretches   Gastroc Stretch  Both;3 reps;30 seconds    Gastroc Stretch Limitations  slant board    Soleus Stretch  Left;30 seconds      Knee/Hip Exercises: Aerobic   Elliptical  5 minutes level 8       Knee/Hip Exercises: Machines for Strengthening   Cybex Knee Extension  LLE, 20#, 3x12 reps    Total Gym Leg Press   BodyCraft Leg Press: Lt LE only, 60lbs, 3x12 reps      Knee/Hip Exercises: Plyometrics   Other Plyometric Exercises  Jumping drills BLE: in place, fwd/bwd, lateral, 3x30 reps each    Other Plyometric Exercises  Single leg lateral jumps, 2x10 reps      Knee/Hip Exercises: Standing   Other Standing Knee Exercises  Heavy box-ups 20#, 18" step, 3x10 reps; single leg RDL with 50# cable row, 3x8 reps each side    Other Standing Knee Exercises  DB one arm snatch, 10#, 2x10 reps each; heel taps, L SLS, x10 reps from 4" box and 2x10 reps 6" box               PT Short Term Goals - 10/29/18 1720      PT SHORT TERM GOAL #1   Title  Patient will be independent with HEP, updated PRN, toprogress safely through rehab protocol.     Time  1    Period  Weeks    Status  Achieved    Target Date  08/20/18      PT SHORT TERM GOAL #2   Title  Patient will achieve AROM 0-100 to progress  safely to next phase of protocol for strengthening and mobility/ROM exercises.     Time  3    Period  Weeks    Status  Achieved    Target Date  09/03/18      PT SHORT TERM GOAL #3   Title  Patient will demonstrate normal gait pattern with no brace ro crutches to indicate good quad control and tolerance to FWB to improve mobility in daily activities and progress to next phase of protocol.     Time  4    Period  Weeks    Status  Achieved    Target Date  09/10/18        PT Long Term Goals - 11/26/18 1027      PT LONG TERM GOAL #1   Title  Patient will achieve 0-130 degrees on Lt knee AROM to equal Rt Knee mobility to imrpove fucntional giat and mobility.     Baseline  10/29/18 = 0-130    Time  6    Period  Weeks    Status  Achieved      PT LONG TERM GOAL #2   Title  Patient will demosntrate 70% hamstring strength on Lt knee compared to Rt knee to indicate imrpovements in functional strength with 1 rep max test.     Baseline  pt remains limited (Rt = 145 lbs, LT = 95lbs) Lt is at 65% of Rt    Time  4    additional   Period  Weeks    Status  On-going      PT LONG TERM GOAL #3   Title  Patient will demosntrate 60% quadriceps strength on Lt knee compared to Rt knee to indicate imrpovements in functional strength with 1 rep max test.     Baseline  pt remains limited (Rt = 80lbs, LT = 40lbs) Lt is at 50 % or Rt    Time  4   additional   Period  Weeks    Status  On-going            Plan - 12/10/18 1222    Clinical Impression Statement  continued with ACL protocol with focus on strengthenng of Lt quad and HS.  PT able to complete all therex without c/o pain and increased strength/less mm shaking/fatigue noted during session today.  Lt single leg hops in place continue to be challenging.  Pt completing gym routine without issues.    Personal Factors and Comorbidities  Profession    Examination-Activity Limitations  Bathing;Stairs;Sit;Stand;Transfers    Examination-Participation Restrictions  Driving;Yard Work;Cleaning;Community Activity;Laundry;Other    Stability/Clinical Decision Making  Stable/Uncomplicated    Rehab Potential  Excellent    PT Frequency  2x / week    PT Duration  4 weeks   additional   PT Treatment/Interventions  ADLs/Self Care Home Management;Aquatic Therapy;Cryotherapy;Electrical Stimulation;Moist Heat;DME Instruction;Gait training;Stair training;Functional mobility training;Therapeutic activities;Therapeutic exercise;Balance training;Neuromuscular re-education;Patient/family education;Manual techniques;Scar mobilization;Passive range of motion;Taping    PT Next Visit Plan  Pt currently 22 weeks post-op (12/10/18). Continue L calf stretching and address L ankle limitations with functional strengthening if noted. Continue single leg hops within pain-free range. Continue lunge matrix and progress single leg squats. [facility]. Focus on quad and hamstring strengthening isolating Lt LE.    PT Home Exercise Plan  Eval: quad set, heel  slide, supine knee flexion stretch with rope, hip extension, hamsring curl, hip abduction, heel raises; 08/20/18 - patella mobilization, hamstring isometric, prone knee hang.  10/29/18 - Nordic hamstring and quad curls, bridge walkouts, [place] squats; 7/23: Gym HEP (Heavy box ups, single leg RDL with cable row, [place] squat, swiss ball HS curl, Bosu get down-ups, split jump squats, bridge walk out, DB one arm snatch, single leg press, single leg extension, Nordic HS curl) and L calf stretch for gastroc and soleus    Consulted and Agree with Plan of Care  Patient       Patient will benefit from skilled therapeutic intervention in order to improve the following deficits and impairments:  Abnormal gait, Decreased activity tolerance, Decreased endurance, Decreased range of motion, Decreased strength, Increased edema, Difficulty walking, Decreased mobility, Decreased balance, Increased fascial restricitons  Visit Diagnosis: 1. Acute pain of left knee   2. Stiffness of left knee, not elsewhere classified   3. Muscle weakness (generalized)        Problem List Patient Active Problem List   Diagnosis Date Noted  . CONTUSION OF FOREARM 03/17/2009   Teena Irani, PTA/CLT [phone]  Teena Irani 12/10/2018, 12:25 PM  [place] [address] [place], [place], [address] Phone: [phone]   Fax:  [phone]  Name: Nathaniel [name] MRN: [id number] Date of Birth: Jul 10, 1995

## 2018-12-13 ENCOUNTER — Other Ambulatory Visit: Payer: Self-pay

## 2018-12-13 ENCOUNTER — Encounter (HOSPITAL_COMMUNITY): Payer: Self-pay | Admitting: Physical Therapy

## 2018-12-13 ENCOUNTER — Ambulatory Visit (HOSPITAL_COMMUNITY): Payer: BC Managed Care – PPO | Admitting: Physical Therapy

## 2018-12-13 DIAGNOSIS — M25562 Pain in left knee: Secondary | ICD-10-CM

## 2018-12-13 DIAGNOSIS — M6281 Muscle weakness (generalized): Secondary | ICD-10-CM

## 2018-12-13 DIAGNOSIS — M25662 Stiffness of left knee, not elsewhere classified: Secondary | ICD-10-CM

## 2018-12-13 NOTE — Therapy (Signed)
[facility] [address] [place], [place], [address] Phone: [phone]   Fax:  [phone]  Physical Therapy Treatment  Patient Details  Name: Nathaniel ReeksJonathan R [name] MRN: [id number] Date of Birth: [birth year] Referring Provider (PT): Colvin Caroliixon, Thomas Bradley PA-C    Encounter Date: 12/13/2018   Progress Note Reporting Period 10/29/18 to 12/13/18  See note below for Objective Data and Assessment of Progress/Goals.       PT End of Session - 12/13/18 0901    Visit Number  28   progress note done visit 28   Number of Visits  36    Date for PT Re-Evaluation  01/14/19    Authorization Type  BCBS Other (no auth required, 60 visits per year PT/OT combined, $20 co-pay)    Authorization Time Period  08/13/18 - 10/08/18; 09/26/18-10/26/18; 10/29/18-11/26/18; NEW: 11/26/18-12/24/18; 12/13/18 to 01/14/19    Authorization - Visit Number  28    Authorization - Number of Visits  60    PT Start Time  0822    PT Stop Time  0900    PT Time Calculation (min)  38 min    Activity Tolerance  Patient tolerated treatment well    Behavior During Therapy  [facility] for tasks assessed/performed       No past medical history on file.  Past Surgical History:  Procedure Laterality Date  . ANTERIOR CRUCIATE LIGAMENT REPAIR Left 07/16/2018    There were no vitals filed for this visit.  Subjective Assessment - 12/13/18 0824    Subjective  Doing well today, had a little pain the other day but it went away. Sounds like he will be returning to work in september.    Pertinent History  LT ACL Reconstruction hamstring autograft 07/16/18    Patient Stated Goals  to get back to work and the gym    Currently in Pain?  No/denies         [facility] - 12/13/18 0001      Assessment   Medical Diagnosis  L ACL Reconstruction     Referring Provider (PT)  Colvin Caroliixon, Thomas Bradley PA-C     Onset Date/Surgical Date  07/16/18    Next MD Visit  --   september 2020   Prior Therapy   none       Precautions   Precautions  Other (comment);Knee    Precaution Comments  [facility] ACL Autograft Protocol      Restrictions   Weight Bearing Restrictions  Yes    LLE Weight Bearing  Weight bearing as tolerated      Balance Screen   Has the patient fallen in the past 6 months  No      Home Nurse, mental healthnvironment   Living Environment  Private residence    Chemical engineerLiving Arrangements  Parent;Other relatives      Prior Function   Level of Independence  Independent    Vocation  Full time employment    Vocation Requirements  [employer]       AROM   Left Knee Extension  0   when fatigued, only able to actively extend to 9 degrees    Left Knee Flexion  127      Special Tests   Other special tests  single leg hop 12" difference between L and R LEs, unable to maintain full control with single leg step down, difficulty with single leg squat B and unable to perform on L  [facility] Adult PT Treatment/Exercise - 12/13/18 0001      Knee/Hip Exercises: Machines for Strengthening   Cybex Leg Press  3x15 with 57.5# R and L LE       Knee/Hip Exercises: Standing   Forward Lunges Limitations  3x51ft; retro lunges 3x89ft with 20#     Functional Squat  1 set;20 reps    Functional Squat Limitations  20# 180 jump squats     SLS  star taps from squat position 20# 1x10; single leg deadlifts 20# with chamber into overhead 1x20    Other Standing Knee Exercises  side shuffles for warm up, standard squats for warm up     Other Standing Knee Exercises  single leg hopping  3x64ft; 20# deadlifts off of 8 inch box; speed skaters 1x20; mountain climbers floor level 1x20               PT Education - 12/13/18 0901    Education Details  POC moving forward, exercise technique    Person(s) Educated  Patient    Methods  Explanation    Comprehension  Verbalized understanding       PT Short Term Goals - 12/13/18 0903      PT SHORT TERM GOAL #1   Title  Patient will be  independent with HEP, updated PRN, toprogress safely through rehab protocol.     Time  1    Period  Weeks    Status  Achieved    Target Date  08/20/18      PT SHORT TERM GOAL #2   Title  Patient will achieve AROM 0-100 to progress safely to next phase of protocol for strengthening and mobility/ROM exercises.     Time  3    Period  Weeks    Status  Achieved      PT SHORT TERM GOAL #3   Title  Patient will demonstrate normal gait pattern with no brace ro crutches to indicate good quad control and tolerance to FWB to improve mobility in daily activities and progress to next phase of protocol.     Time  4    Period  Weeks    Status  Achieved        PT Long Term Goals - 12/13/18 1443      PT LONG TERM GOAL #1   Title  Patient will achieve 0-130 degrees on Lt knee AROM to equal Rt Knee mobility to imrpove fucntional giat and mobility.     Baseline  10/29/18 = 0-130 7/30- continue to note quad lag when fatigued with active extesnion, otherwise able to reach 0    Time  6    Period  Weeks      PT LONG TERM GOAL #2   Title  Patient will demosntrate 70% hamstring strength on Lt knee compared to Rt knee to indicate imrpovements in functional strength with 1 rep max test.     Time  4    Period  Weeks    Status  On-going      PT LONG TERM GOAL #3   Title  Patient will demosntrate 60% quadriceps strength on Lt knee compared to Rt knee to indicate imrpovements in functional strength with 1 rep max test.     Time  4    Period  Weeks    Status  On-going      PT LONG TERM GOAL #4   Title  Patient to demonstrate equal distance single leg hop as well as good  eccentric control with single leg step down, will also be able to perform single leg squats to equal depth without pain or valgus bias    Time  4    Period  Weeks    Status  New    Target Date  01/14/19            Plan - 12/13/18 0902    Clinical Impression Statement  Patient arrives in good spirits today, knee is feeling well  but did have a bit of pain the other day. ROM is full, and strength is improving greatly however patient continues to demonstrate difficulty with sports based activities and tests including single leg hop, single leg squat, and eccentric step down. Patient currently with 12" difference between legs with single leg hops, unable to perform single leg squat with good form, and also with difficulty performing single leg heel touch/step down due to eccentric weakness and control. Recommend continuation of skilled PT services to continue addressing readiness for dynamic activities and full return to gym routine.    Personal Factors and Comorbidities  Profession    Examination-Activity Limitations  Bathing;Stairs;Sit;Stand;Transfers    Examination-Participation Restrictions  Driving;Yard Work;Cleaning;Community Activity;Laundry;Other    Stability/Clinical Decision Making  Stable/Uncomplicated    Clinical Decision Making  Low    Rehab Potential  Excellent    PT Frequency  2x / week    PT Duration  4 weeks    PT Treatment/Interventions  ADLs/Self Care Home Management;Aquatic Therapy;Cryotherapy;Electrical Stimulation;Moist Heat;DME Instruction;Gait training;Stair training;Functional mobility training;Therapeutic activities;Therapeutic exercise;Balance training;Neuromuscular re-education;Patient/family education;Manual techniques;Scar mobilization;Passive range of motion;Taping    PT Next Visit Plan  Pt currently 22 weeks post-op (12/10/18). Continue L calf stretching and address L ankle limitations with functional strengthening if noted. Continue single leg hops within pain-free range. Continue lunge matrix and progress single leg squats. [facility]. Focus on quad and hamstring strengthening isolating Lt LE.    PT Home Exercise Plan  Eval: quad set, heel slide, supine knee flexion stretch with rope, hip extension, hamsring curl, hip abduction, heel raises; 08/20/18 - patella  mobilization, hamstring isometric, prone knee hang. 10/29/18 - Nordic hamstring and quad curls, bridge walkouts, [place] squats; 7/23: Gym HEP (Heavy box ups, single leg RDL with cable row, [place] squat, swiss ball HS curl, Bosu get down-ups, split jump squats, bridge walk out, DB one arm snatch, single leg press, single leg extension, Nordic HS curl) and L calf stretch for gastroc and soleus    Consulted and Agree with Plan of Care  Patient       Patient will benefit from skilled therapeutic intervention in order to improve the following deficits and impairments:  Abnormal gait, Decreased activity tolerance, Decreased endurance, Decreased range of motion, Decreased strength, Increased edema, Difficulty walking, Decreased mobility, Decreased balance, Increased fascial restricitons  Visit Diagnosis: 1. Acute pain of left knee   2. Stiffness of left knee, not elsewhere classified   3. Muscle weakness (generalized)        Problem List Patient Active Problem List   Diagnosis Date Noted  . CONTUSION OF FOREARM 03/17/2009    Nedra HaiKristen Legrand Lasser PT, DPT, CBIS  Supplemental Physical Therapist [facility]    Pager [phone] Acute Rehab Office [phone]   [facility] [address] [place], [place], [address] Phone: [phone]   Fax:  [phone]  Name: Nathaniel ReeksJonathan R [name] MRN: [id number] Date of Birth: July 05, 1995

## 2018-12-18 ENCOUNTER — Other Ambulatory Visit: Payer: Self-pay

## 2018-12-18 ENCOUNTER — Encounter (HOSPITAL_COMMUNITY): Payer: Self-pay

## 2018-12-18 ENCOUNTER — Ambulatory Visit (HOSPITAL_COMMUNITY): Payer: BC Managed Care – PPO | Attending: Physician Assistant

## 2018-12-18 DIAGNOSIS — M25562 Pain in left knee: Secondary | ICD-10-CM

## 2018-12-18 DIAGNOSIS — M25662 Stiffness of left knee, not elsewhere classified: Secondary | ICD-10-CM | POA: Insufficient documentation

## 2018-12-18 DIAGNOSIS — M6281 Muscle weakness (generalized): Secondary | ICD-10-CM | POA: Insufficient documentation

## 2018-12-18 NOTE — Therapy (Signed)
[place] [facility] [address] [place], [place], [address] Phone: [phone]   Fax:  [phone]  Physical Therapy Treatment  Patient Details  Name: Nathaniel ReeksJonathan R [name] MRN: [id number] Date of Birth: 02-17-1996 Referring Provider (PT): Colvin Caroliixon, Thomas Bradley PA-C    Encounter Date: 12/18/2018  PT End of Session - 12/18/18 0817    Visit Number  29   Progress note complete visit #28 on 12/13/18   Number of Visits  36    Date for PT Re-Evaluation  01/14/19    Authorization Type  BCBS Other (no auth required, 60 visits per year PT/OT combined, $20 co-pay)    Authorization Time Period  08/13/18 - 10/08/18; 09/26/18-10/26/18; 10/29/18-11/26/18; NEW: 11/26/18-12/24/18; 12/13/18 to 01/14/19    Authorization - Visit Number  29    Authorization - Number of Visits  60    PT Start Time  [phone] [id number]   5' oin elliptical, not included wiht charges   PT Stop Time  0859    PT Time Calculation (min)  43 min    Activity Tolerance  Patient tolerated treatment well    Behavior During Therapy  [facility] for tasks assessed/performed       History reviewed. No pertinent past medical history.  Past Surgical History:  Procedure Laterality Date  . ANTERIOR CRUCIATE LIGAMENT REPAIR Left 07/16/2018    There were no vitals filed for this visit.  Subjective Assessment - 12/18/18 0816    Subjective  Feeling good, no reports of pain or new issues.  Continues with HEP, reports exercises are getting easier but still feel appropriate    Pertinent History  LT ACL Reconstruction hamstring autograft 07/16/18    Patient Stated Goals  to get back to work and the gym    Currently in Pain?  No/denies                       [facility]/Ex[facility] - 12/18/18 0001      Knee/Hip Exercises: Stretches   Gastroc Stretch  Both;3 reps;30 seconds    Gastroc Stretch Limitations  slant board      Knee/Hip Exercises: Aerobic   Elliptical  5 minutes level 8       Knee/Hip  Exercises: Machines for Strengthening   Cybex Knee Extension  1RM Lt 50#, Rt 80#: 62%    Cybex Knee Flexion  1RM Rt 120#, Lt 74.5# 62%    Cybex Leg Press  3x15 with 60# R and L LE       Knee/Hip Exercises: Standing   Forward Lunges Limitations  3x5730ft; retro lunges 3x230ft with 20#     Functional Squat  1 set;20 reps    Functional Squat Limitations  20# 180 jump squats     SLS  star taps from squat position 20# 1x10; single leg deadlifts 20# with chamber into overhead 1x20    Other Standing Knee Exercises  side shuffles for warm up, standard squats for warm up     Other Standing Knee Exercises  single leg hopping  3x7820ft; 20# deadlifts off of 8 inch box; speed skaters 1x20; mountain climbers floor level 2x 30"               PT Short Term Goals - 12/13/18 [id number]      PT SHORT TERM GOAL #1   Title  Patient will be independent with HEP, updated PRN, toprogress safely through rehab protocol.     Time  1  Period  Weeks    Status  Achieved    Target Date  08/20/18      PT SHORT TERM GOAL #2   Title  Patient will achieve AROM 0-100 to progress safely to next phase of protocol for strengthening and mobility/ROM exercises.     Time  3    Period  Weeks    Status  Achieved      PT SHORT TERM GOAL #3   Title  Patient will demonstrate normal gait pattern with no brace ro crutches to indicate good quad control and tolerance to FWB to improve mobility in daily activities and progress to next phase of protocol.     Time  4    Period  Weeks    Status  Achieved        PT Long Term Goals - 12/13/18 [id number]      PT LONG TERM GOAL #1   Title  Patient will achieve 0-130 degrees on Lt knee AROM to equal Rt Knee mobility to imrpove fucntional giat and mobility.     Baseline  10/29/18 = 0-130 7/30- continue to note quad lag when fatigued with active extesnion, otherwise able to reach 0    Time  6    Period  Weeks      PT LONG TERM GOAL #2   Title  Patient will demosntrate 70% hamstring  strength on Lt knee compared to Rt knee to indicate imrpovements in functional strength with 1 rep max test.     Time  4    Period  Weeks    Status  On-going      PT LONG TERM GOAL #3   Title  Patient will demosntrate 60% quadriceps strength on Lt knee compared to Rt knee to indicate imrpovements in functional strength with 1 rep max test.     Time  4    Period  Weeks    Status  On-going      PT LONG TERM GOAL #4   Title  Patient to demonstrate equal distance single leg hop as well as good eccentric control with single leg step down, will also be able to perform single leg squats to equal depth without pain or valgus bias    Time  4    Period  Weeks    Status  New    Target Date  01/14/19            Plan - 12/18/18 [id number]    Clinical Impression Statement  Continued with established POC for strengthening.  1RM complete with quad and hamstrings at 62% compared to Rt LE strength.  Pt continues to have difficulty with plyometrics, eccentric quad control and single leg stance activities with proper form and good mechanics.  EOS pt limited by fatigue, no reports of pain through session.    Personal Factors and Comorbidities  Profession    Examination-Activity Limitations  Bathing;Stairs;Sit;Stand;Transfers    Examination-Participation Restrictions  Driving;Yard Work;Cleaning;Community Activity;Laundry;Other    Stability/Clinical Decision Making  Stable/Uncomplicated    Clinical Decision Making  Low    Rehab Potential  Excellent    PT Frequency  2x / week    PT Duration  4 weeks    PT Treatment/Interventions  ADLs/Self Care Home Management;Aquatic Therapy;Cryotherapy;Electrical Stimulation;Moist Heat;DME Instruction;Gait training;Stair training;Functional mobility training;Therapeutic activities;Therapeutic exercise;Balance training;Neuromuscular re-education;Patient/family education;Manual techniques;Scar mobilization;Passive range of motion;Taping    PT Next Visit Plan  Pt currently 23  weeks post-op (12/17/18). Continue L calf stretching and address L ankle  limitations with functional strengthening if noted. Continue single leg hops within pain-free range. Continue lunge matrix and progress single leg squats. Follow [facility] orthopedic ACL Autograft protocol. Focus on quad and hamstring strengthening isolating Lt LE.    PT Home Exercise Plan  Eval: quad set, heel slide, supine knee flexion stretch with rope, hip extension, hamsring curl, hip abduction, heel raises; 08/20/18 - patella mobilization, hamstring isometric, prone knee hang. 10/29/18 - Nordic hamstring and quad curls, bridge walkouts, [place] squats; 7/23: Gym HEP (Heavy box ups, single leg RDL with cable row, [place] squat, swiss ball HS curl, Bosu get down-ups, split jump squats, bridge walk out, DB one arm snatch, single leg press, single leg extension, Nordic HS curl) and L calf stretch for gastroc and soleus       Patient will benefit from skilled therapeutic intervention in order to improve the following deficits and impairments:  Abnormal gait, Decreased activity tolerance, Decreased endurance, Decreased range of motion, Decreased strength, Increased edema, Difficulty walking, Decreased mobility, Decreased balance, Increased fascial restricitons  Visit Diagnosis: 1. Acute pain of left knee   2. Stiffness of left knee, not elsewhere classified   3. Muscle weakness (generalized)        Problem List Patient Active Problem List   Diagnosis Date Noted  . CONTUSION OF FOREARM 03/17/2009   Ihor Austin, LPTA; CBIS [phone]  Aldona Lento 12/18/2018, 9:12 AM  [place], [place], [address] Phone: [phone]   Fax:  [phone]  Name: Nathaniel [name] MRN: [id number] Date of Birth: May 22, 1995

## 2018-12-20 ENCOUNTER — Other Ambulatory Visit: Payer: Self-pay

## 2018-12-20 ENCOUNTER — Encounter (HOSPITAL_COMMUNITY): Payer: Self-pay | Admitting: Physical Therapy

## 2018-12-20 ENCOUNTER — Ambulatory Visit (HOSPITAL_COMMUNITY): Payer: BC Managed Care – PPO | Admitting: Physical Therapy

## 2018-12-20 DIAGNOSIS — M25562 Pain in left knee: Secondary | ICD-10-CM | POA: Diagnosis not present

## 2018-12-20 DIAGNOSIS — M25662 Stiffness of left knee, not elsewhere classified: Secondary | ICD-10-CM

## 2018-12-20 DIAGNOSIS — M6281 Muscle weakness (generalized): Secondary | ICD-10-CM

## 2018-12-20 NOTE — Therapy (Signed)
Nathaniel [name], [place], [address] Phone: [phone]   Fax:  [phone]  Physical Therapy Treatment  Patient Details  Name: Nathaniel [name] MRN: [id number] Date of Birth: Nov 27, 1995 Referring Provider (PT): Cline Crock PA-C    Encounter Date: 12/20/2018  PT End of Session - 12/20/18 0901    Visit Number  30    Number of Visits  36    Date for PT Re-Evaluation  01/14/19    Authorization Type  BCBS Other (no auth required, 60 visits per year PT/OT combined, $20 co-pay)    Authorization Time Period  08/13/18 - 10/08/18; 09/26/18-10/26/18; 10/29/18-11/26/18; NEW: 11/26/18-12/24/18; 12/13/18 to 01/14/19    Authorization - Visit Number  30    Authorization - Number of Visits  60    PT Start Time  7078   time on elliptical not included in billing   PT Stop Time  0900    PT Time Calculation (min)  38 min       History reviewed. No pertinent past medical history.  Past Surgical History:  Procedure Laterality Date  . ANTERIOR CRUCIATE LIGAMENT REPAIR Left 07/16/2018    There were no vitals filed for this visit.  Subjective Assessment - 12/20/18 0817    Subjective  Feeling good today, little sore from last session but otherwise no changes since last time.    Pertinent History  LT ACL Reconstruction hamstring autograft 07/16/18    Currently in Pain?  No/denies                       [facility] Adult PT Treatment/Exercise - 12/20/18 0001      Knee/Hip Exercises: Aerobic   Elliptical  7 minutes level 8   not included in billing      Knee/Hip Exercises: Standing   Functional Squat  1 set;20 reps    Functional Squat Limitations  BOSU     Wall Squat  5 reps    Wall Squat Limitations  30 second holds, 20# weight     Lunge Walking - Round Trips  20# forward and backward lunges 66ft round trip x3     SLS  4 way star taps on blue foam pad 1x10 B     Rebounder  60 seconds on blue foam pad blue ball (3000kg  toss)    2 rounds    Other Standing Knee Exercises  shrimp squats 1x5 B;  elevated on 8 inch box and with blue foam pad; forward lunges on BOSU 20#  1x10 B; hostages 1x10 B to blue pad on floor       Other Standing Knee Exercises  squat walks 20# 62ft x2; side lunges on BOSU 20# 1x10 B              PT Education - 12/20/18 0901    Education Details  exercise form, importance of being pain free with progressive activities    Person(s) Educated  Patient    Methods  Explanation    Comprehension  Verbalized understanding       PT Short Term Goals - 12/13/18 0903      PT SHORT TERM GOAL #1   Title  Patient will be independent with HEP, updated PRN, toprogress safely through rehab protocol.     Time  1    Period  Weeks    Status  Achieved    Target Date  08/20/18      PT  SHORT TERM GOAL #2   Title  Patient will achieve AROM 0-100 to progress safely to next phase of protocol for strengthening and mobility/ROM exercises.     Time  3    Period  Weeks    Status  Achieved      PT SHORT TERM GOAL #3   Title  Patient will demonstrate normal gait pattern with no brace ro crutches to indicate good quad control and tolerance to FWB to improve mobility in daily activities and progress to next phase of protocol.     Time  4    Period  Weeks    Status  Achieved        PT Long Term Goals - 12/13/18 [id number]      PT LONG TERM GOAL #1   Title  Patient will achieve 0-130 degrees on Lt knee AROM to equal Rt Knee mobility to imrpove fucntional giat and mobility.     Baseline  10/29/18 = 0-130 7/30- continue to note quad lag when fatigued with active extesnion, otherwise able to reach 0    Time  6    Period  Weeks      PT LONG TERM GOAL #2   Title  Patient will demosntrate 70% hamstring strength on Lt knee compared to Rt knee to indicate imrpovements in functional strength with 1 rep max test.     Time  4    Period  Weeks    Status  On-going      PT LONG TERM GOAL #3   Title  Patient will  demosntrate 60% quadriceps strength on Lt knee compared to Rt knee to indicate imrpovements in functional strength with 1 rep max test.     Time  4    Period  Weeks    Status  On-going      PT LONG TERM GOAL #4   Title  Patient to demonstrate equal distance single leg hop as well as good eccentric control with single leg step down, will also be able to perform single leg squats to equal depth without pain or valgus bias    Time  4    Period  Weeks    Status  New    Target Date  01/14/19            Plan - 12/20/18 0902    Clinical Impression Statement  Continued with advanced functional strengthening and conditioning tasks today, with focus on control and coordination of surgical LE. Also incorporated tasks for muscular endurance of surgical LE, working towards longer duration athletic activities. Continued to provide cues for knee alignment and avoidance of surgical knee torsion with dynamic activities. Additional rest breaks provided between activities as patient reports more muscle soreness/fatigue than usual today. Introduced shrimp squats for improved coordination and eccentric control, used elevated surface for safety due to significant difficulty with this task.    Personal Factors and Comorbidities  Profession    Examination-Activity Limitations  Bathing;Stairs;Sit;Stand;Transfers    Examination-Participation Restrictions  Driving;Yard Work;Cleaning;Community Activity;Laundry;Other    Stability/Clinical Decision Making  Stable/Uncomplicated    Clinical Decision Making  Low    Rehab Potential  Excellent    PT Frequency  2x / week    PT Duration  4 weeks    PT Treatment/Interventions  ADLs/Self Care Home Management;Aquatic Therapy;Cryotherapy;Electrical Stimulation;Moist Heat;DME Instruction;Gait training;Stair training;Functional mobility training;Therapeutic activities;Therapeutic exercise;Balance training;Neuromuscular re-education;Patient/family education;Manual techniques;Scar  mobilization;Passive range of motion;Taping    PT Next Visit Plan  Box jumps! Pt currently 23  weeks post-op (12/17/18). Continue L calf stretching and address L ankle limitations with functional strengthening if noted. Continue single leg hops within pain-free range. Continue lunge matrix and progress single leg squats. [facility]. Focus on quad and hamstring strengthening isolating Lt LE.    PT Home Exercise Plan  Eval: quad set, heel slide, supine knee flexion stretch with rope, hip extension, hamsring curl, hip abduction, heel raises; 08/20/18 - patella mobilization, hamstring isometric, prone knee hang. 10/29/18 - Nordic hamstring and quad curls, bridge walkouts, [place] squats; 7/23: Gym HEP (Heavy box ups, single leg RDL with cable row, [place] squat, swiss ball HS curl, Bosu get down-ups, split jump squats, bridge walk out, DB one arm snatch, single leg press, single leg extension, Nordic HS curl) and L calf stretch for gastroc and soleus; 8/6 added 4 way star taps to gym routine    Consulted and Agree with Plan of Care  Patient       Patient will benefit from skilled therapeutic intervention in order to improve the following deficits and impairments:  Abnormal gait, Decreased activity tolerance, Decreased endurance, Decreased range of motion, Decreased strength, Increased edema, Difficulty walking, Decreased mobility, Decreased balance, Increased fascial restricitons  Visit Diagnosis: 1. Acute pain of left knee   2. Stiffness of left knee, not elsewhere classified   3. Muscle weakness (generalized)        Problem List Patient Active Problem List   Diagnosis Date Noted  . CONTUSION OF FOREARM 03/17/2009   Nedra HaiKristen Unger PT, DPT, CBIS  Supplemental Physical Therapist [facility]    Pager [phone] Acute Rehab Office [phone]   [facility] [address] [place], [place], [address] Phone:  [phone]   Fax:  [phone]  Name: Nathaniel [name] MRN: [id number] Date of Birth: Feb 01, 1996

## 2018-12-26 ENCOUNTER — Other Ambulatory Visit: Payer: Self-pay

## 2018-12-26 ENCOUNTER — Ambulatory Visit (HOSPITAL_COMMUNITY): Payer: BC Managed Care – PPO

## 2018-12-26 ENCOUNTER — Encounter (HOSPITAL_COMMUNITY): Payer: Self-pay

## 2018-12-26 DIAGNOSIS — M6281 Muscle weakness (generalized): Secondary | ICD-10-CM

## 2018-12-26 DIAGNOSIS — M25662 Stiffness of left knee, not elsewhere classified: Secondary | ICD-10-CM

## 2018-12-26 DIAGNOSIS — M25562 Pain in left knee: Secondary | ICD-10-CM

## 2018-12-26 NOTE — Therapy (Signed)
[facility] [facility] [address] [place], [place], [address] Phone: [phone]   Fax:  [phone]  Physical Therapy Treatment  Patient Details  Name: Nathaniel [name] MRN: [id number] Date of Birth: 08/16/1995 Referring Provider (PT): Colvin Caroliixon, Thomas Bradley PA-C    Encounter Date: 12/26/2018  PT End of Session - 12/26/18 0819    Visit Number  31    Number of Visits  36    Date for PT Re-Evaluation  01/14/19    Authorization Type  BCBS Other (no auth required, 60 visits per year PT/OT combined, $20 co-pay)    Authorization Time Period  08/13/18 - 10/08/18; 09/26/18-10/26/18; 10/29/18-11/26/18; NEW: 11/26/18-12/24/18; 12/13/18 to 01/14/19    Authorization - Visit Number  31    Authorization - Number of Visits  60    PT Start Time  [phone]   5' on elliptical, not included with charges   PT Stop Time  0902    PT Time Calculation (min)  45 min    Activity Tolerance  Patient tolerated treatment well    Behavior During Therapy  [facility] for tasks assessed/performed       History reviewed. No pertinent past medical history.  Past Surgical History:  Procedure Laterality Date  . ANTERIOR CRUCIATE LIGAMENT REPAIR Left 07/16/2018    There were no vitals filed for this visit.  Subjective Assessment - 12/26/18 0819    Subjective  Feeling good, no reports of pain today.    Patient Stated Goals  to get back to work and the gym    Currently in Pain?  No/denies                       [facility] Adult PT Treatment/Exercise - 12/26/18 0001      Knee/Hip Exercises: Aerobic   Elliptical  5 minutes level 8      Knee/Hip Exercises: Machines for Strengthening   Cybex Knee Extension  2x 10 20# SL    Cybex Knee Flexion  2x 10 4.5Pl= 40.5#, increase next session    Cybex Leg Press  3x15 with 60# R and L LE       Knee/Hip Exercises: Plyometrics   Box Circuit  5 sets;Box Height: 4";Box Height: 8"    Other Plyometric Exercises  Jumping drills BLE: in place,  fwd/bwd, lateral, 3x30 reps each    Other Plyometric Exercises  Single leg lateral jumps, 2x10 reps      Knee/Hip Exercises: Standing   Forward Lunges  Both;2 sets;10 reps    Forward Lunges Limitations  BOSU with 20#    Side Lunges  Both;2 sets;15 reps    Functional Squat  1 set;20 reps    Functional Squat Limitations  BOSU     SLS  4 way star taps on blue foam pad 1x10 B     Gait Training  Sports warm up: high knee, hamstring kicks, minisquat sidestep 2RT    Other Standing Knee Exercises  forward lunges on BOSU 20#  1x10 B; hostages 1x10 B to blue pad on floor       Other Standing Knee Exercises  squat walks 20# 3260ft x2; side lunges on BOSU 20# 1x10 B                PT Short Term Goals - 12/13/18 [id number]      PT SHORT TERM GOAL #1   Title  Patient will be independent with HEP, updated PRN, toprogress safely through rehab protocol.  Time  1    Period  Weeks    Status  Achieved    Target Date  08/20/18      PT SHORT TERM GOAL #2   Title  Patient will achieve AROM 0-100 to progress safely to next phase of protocol for strengthening and mobility/ROM exercises.     Time  3    Period  Weeks    Status  Achieved      PT SHORT TERM GOAL #3   Title  Patient will demonstrate normal gait pattern with no brace ro crutches to indicate good quad control and tolerance to FWB to improve mobility in daily activities and progress to next phase of protocol.     Time  4    Period  Weeks    Status  Achieved        PT Long Term Goals - 12/13/18 6269      PT LONG TERM GOAL #1   Title  Patient will achieve 0-130 degrees on Lt knee AROM to equal Rt Knee mobility to imrpove fucntional giat and mobility.     Baseline  10/29/18 = 0-130 7/30- continue to note quad lag when fatigued with active extesnion, otherwise able to reach 0    Time  6    Period  Weeks      PT LONG TERM GOAL #2   Title  Patient will demosntrate 70% hamstring strength on Lt knee compared to Rt knee to indicate  imrpovements in functional strength with 1 rep max test.     Time  4    Period  Weeks    Status  On-going      PT LONG TERM GOAL #3   Title  Patient will demosntrate 60% quadriceps strength on Lt knee compared to Rt knee to indicate imrpovements in functional strength with 1 rep max test.     Time  4    Period  Weeks    Status  On-going      PT LONG TERM GOAL #4   Title  Patient to demonstrate equal distance single leg hop as well as good eccentric control with single leg step down, will also be able to perform single leg squats to equal depth without pain or valgus bias    Time  4    Period  Weeks    Status  New    Target Date  01/14/19            Plan - 12/26/18 0904    Clinical Impression Statement  Pt at 24 weeks post-op ACL reconstrustion.  Continued with established POC for functional strengthening, isometric quad and hamstring strengthening and progressed plyometrics.  Began box jumping today, cueing for soft landing mechanics and equal landing.  Also added sports warm-up with noted weakness compared to opposite LE (decreased stance time, knee flexion and ease with activity).    Personal Factors and Comorbidities  Profession    Examination-Activity Limitations  Bathing;Stairs;Sit;Stand;Transfers    Examination-Participation Restrictions  Driving;Yard Work;Cleaning;Community Activity;Laundry;Other    Stability/Clinical Decision Making  Stable/Uncomplicated    Clinical Decision Making  Low    Rehab Potential  Excellent    PT Frequency  2x / week    PT Duration  4 weeks    PT Treatment/Interventions  ADLs/Self Care Home Management;Aquatic Therapy;Cryotherapy;Electrical Stimulation;Moist Heat;DME Instruction;Gait training;Stair training;Functional mobility training;Therapeutic activities;Therapeutic exercise;Balance training;Neuromuscular re-education;Patient/family education;Manual techniques;Scar mobilization;Passive range of motion;Taping    PT Next Visit Plan  Box jumps!   Increase weight  wiht leg press and hamstring cybex next session.   Pt currently 24 weeks post-op (12/24/18). Continue L calf stretching and address L ankle limitations with functional strengthening if noted. Continue single leg hops within pain-free range. Continue lunge matrix and progress single leg squats. [facility]. Focus on quad and hamstring strengthening isolating Lt LE.    PT Home Exercise Plan  Eval: quad set, heel slide, supine knee flexion stretch with rope, hip extension, hamsring curl, hip abduction, heel raises; 08/20/18 - patella mobilization, hamstring isometric, prone knee hang. 10/29/18 - Nordic hamstring and quad curls, bridge walkouts, [place] squats; 7/23: Gym HEP (Heavy box ups, single leg RDL with cable row, [place] squat, swiss ball HS curl, Bosu get down-ups, split jump squats, bridge walk out, DB one arm snatch, single leg press, single leg extension, Nordic HS curl) and L calf stretch for gastroc and soleus; 8/6 added 4 way star taps to gym routine       Patient will benefit from skilled therapeutic intervention in order to improve the following deficits and impairments:  Abnormal gait, Decreased activity tolerance, Decreased endurance, Decreased range of motion, Decreased strength, Increased edema, Difficulty walking, Decreased mobility, Decreased balance, Increased fascial restricitons  Visit Diagnosis: 1. Muscle weakness (generalized)   2. Acute pain of left knee   3. Stiffness of left knee, not elsewhere classified        Problem List Patient Active Problem List   Diagnosis Date Noted  . CONTUSION OF FOREARM 03/17/2009   Becky Saxasey [name], LPTA; CBIS [phone]  Juel BurrowCockerham, Nathaniel Jo 12/26/2018, 1:17 PM   [facility] [address] [place], [place], [address] Phone: [phone]   Fax:  [phone]  Name: Nathaniel [name] MRN: [id number] Date of Birth: [birth year]

## 2018-12-28 ENCOUNTER — Telehealth (HOSPITAL_COMMUNITY): Payer: Self-pay

## 2018-12-28 ENCOUNTER — Ambulatory Visit (HOSPITAL_COMMUNITY): Payer: BC Managed Care – PPO

## 2018-12-28 NOTE — Telephone Encounter (Signed)
No show, called and left message concerning missed apt., reminded next apt date and time with contact information given.  [address] [place]; CBIS [phone]

## 2018-12-31 ENCOUNTER — Telehealth (HOSPITAL_COMMUNITY): Payer: Self-pay

## 2018-12-31 NOTE — Telephone Encounter (Signed)
lmonvm advising the pt that he had an appt on 12/28/2018 that he did not show up for and also lmonvm with his next appt d/t.

## 2019-01-01 ENCOUNTER — Ambulatory Visit (HOSPITAL_COMMUNITY): Payer: BC Managed Care – PPO | Admitting: Physical Therapy

## 2019-01-01 ENCOUNTER — Telehealth (HOSPITAL_COMMUNITY): Payer: Self-pay | Admitting: Physical Therapy

## 2019-01-01 NOTE — Telephone Encounter (Signed)
No-show #2. Called and [facility] regarding this no-show, as well as no-show and DC policy. Asked patient to please call clinic back if something has changed in his schedule or if he has concerns in continuing with therapy. Advised that if he has one more no-show, he will be discharged as per clinic policy, and left time and date of next session.  Deniece Ree PT, DPT, CBIS  Supplemental Physical Therapist [facility]    Pager [phone] Acute Rehab Office [phone]

## 2019-01-03 ENCOUNTER — Encounter (HOSPITAL_COMMUNITY): Payer: Self-pay | Admitting: Physical Therapy

## 2019-01-03 ENCOUNTER — Telehealth (HOSPITAL_COMMUNITY): Payer: Self-pay | Admitting: Physical Therapy

## 2019-01-03 ENCOUNTER — Ambulatory Visit (HOSPITAL_COMMUNITY): Payer: BC Managed Care – PPO | Admitting: Physical Therapy

## 2019-01-03 NOTE — Telephone Encounter (Signed)
3rd consecutive no-show. Called and [facility] regarding 3 no-shows and that he is now discharged from PT per clinic policy. He will need new MD order if he wishes to return.   Deniece Ree PT, DPT, CBIS  Supplemental Physical Therapist [facility]    Pager [phone] Acute Rehab Office [phone]

## 2019-01-03 NOTE — Therapy (Signed)
[place] [address] [place], [place], [address] Phone: [phone]   Fax:  [phone]  Patient Details  Name: Nathaniel [name] MRN: [id number] Date of Birth: January 04, 1996 Referring Provider:  No ref. provider found  Encounter Date: 01/03/2019   PHYSICAL THERAPY DISCHARGE SUMMARY  Visits from Start of Care: 31  Current functional level related to goals / functional outcomes: Patient has had 3 no-shows in a row and has not returned phone calls. He is being formally DC-ed per clinic policy.    Remaining deficits: Surgical LE weakness, reduced functional activity tolerance, low 1 RM as compared to non-surgical LE    Education / Equipment: DC per clinic policy  Plan: Patient agrees to discharge.  Patient goals were partially met. Patient is being discharged due to not returning since the last visit.  ?????       Deniece Ree PT, DPT, CBIS  Supplemental Physical Therapist [facility]    Pager [phone] Acute Rehab Office [place] [address] [place], [place], [address] Phone: [phone]   Fax:  [phone]

## 2019-01-08 ENCOUNTER — Ambulatory Visit (HOSPITAL_COMMUNITY): Payer: BC Managed Care – PPO | Admitting: Physical Therapy

## 2019-01-11 ENCOUNTER — Encounter (HOSPITAL_COMMUNITY): Payer: BC Managed Care – PPO | Admitting: Physical Therapy

## 2019-07-13 ENCOUNTER — Ambulatory Visit: Payer: BC Managed Care – PPO | Attending: Internal Medicine

## 2019-07-13 DIAGNOSIS — Z23 Encounter for immunization: Secondary | ICD-10-CM | POA: Insufficient documentation

## 2019-07-13 NOTE — Progress Notes (Signed)
   Covid-19 Vaccination Clinic  Name:  SUMIT BRANHAM    MRN: [id number] DOB: 27-Sep-1995  07/13/2019  Mr. Mcfayden was observed post Covid-19 immunization for 15 minutes without incidence. He was provided with Vaccine Information Sheet and instruction to access the V-Safe system.   Mr. Yusko was instructed to call 911 with any severe reactions post vaccine: Marland Kitchen Difficulty breathing  . Swelling of your face and throat  . A fast heartbeat  . A bad rash all over your body  . Dizziness and weakness    Immunizations Administered    Name Date Dose VIS Date Route   Pfizer COVID-19 Vaccine 07/13/2019  6:00 PM 0.3 mL 04/26/2019 Intramuscular   Manufacturer: [employer], [employer]   Lot: [id number]   NDC: [address]-5

## 2019-08-03 ENCOUNTER — Ambulatory Visit: Payer: BC Managed Care – PPO | Attending: Internal Medicine

## 2019-08-03 DIAGNOSIS — Z23 Encounter for immunization: Secondary | ICD-10-CM

## 2019-08-03 NOTE — Progress Notes (Signed)
   Covid-19 Vaccination Clinic  Name:  Nathaniel [name]    MRN: [id number] DOB: 22-Feb-1996  08/03/2019  Mr. Nathaniel [name] was observed post Covid-19 immunization for 15 minutes without incident. He was provided with Vaccine Information Sheet and instruction to access the V-Safe system.   Mr. Nathaniel [name] was instructed to call 911 with any severe reactions post vaccine: Marland Kitchen Difficulty breathing  . Swelling of face and throat  . A fast heartbeat  . A bad rash all over body  . Dizziness and weakness   Immunizations Administered    Name Date Dose VIS Date Route   Pfizer COVID-19 Vaccine 08/03/2019  9:26 AM 0.3 mL 04/26/2019 Intramuscular   Manufacturer: [employer], [employer]   Lot: [id number]   NDC: [address]-3

## 2021-04-05 ENCOUNTER — Other Ambulatory Visit: Payer: Self-pay

## 2021-04-05 ENCOUNTER — Encounter (HOSPITAL_COMMUNITY): Payer: Self-pay | Admitting: Emergency Medicine

## 2021-04-05 ENCOUNTER — Emergency Department (HOSPITAL_COMMUNITY): Payer: BC Managed Care – PPO

## 2021-04-05 ENCOUNTER — Emergency Department (HOSPITAL_COMMUNITY)
Admission: EM | Admit: 2021-04-05 | Discharge: 2021-04-05 | Disposition: A | Discharge status: Home / Self Care | Payer: BC Managed Care – PPO | Attending: Emergency Medicine | Admitting: Emergency Medicine

## 2021-04-05 DIAGNOSIS — Z7982 Long term (current) use of aspirin: Secondary | ICD-10-CM | POA: Insufficient documentation

## 2021-04-05 DIAGNOSIS — R42 Dizziness and giddiness: Secondary | ICD-10-CM | POA: Diagnosis present

## 2021-04-05 DIAGNOSIS — H81391 Other peripheral vertigo, right ear: Secondary | ICD-10-CM | POA: Diagnosis not present

## 2021-04-05 DIAGNOSIS — H5500 Unspecified nystagmus: Secondary | ICD-10-CM | POA: Diagnosis not present

## 2021-04-05 LAB — CBC WITH DIFFERENTIAL/PLATELET
Abs Immature Granulocytes: 0.05 10*3/uL (ref 0.00–0.07)
Basophils Absolute: 0.1 10*3/uL (ref 0.0–0.1)
Basophils Relative: 1 %
Eosinophils Absolute: 0.2 10*3/uL (ref 0.0–0.5)
Eosinophils Relative: 3 %
HCT: 43.6 % (ref 39.0–52.0)
Hemoglobin: 14.2 g/dL (ref 13.0–17.0)
Immature Granulocytes: 1 %
Lymphocytes Relative: 33 %
Lymphs Abs: 2.7 10*3/uL (ref 0.7–4.0)
MCH: 27.1 pg (ref 26.0–34.0)
MCHC: 32.6 g/dL (ref 30.0–36.0)
MCV: 83.2 fL (ref 80.0–100.0)
Monocytes Absolute: 0.6 10*3/uL (ref 0.1–1.0)
Monocytes Relative: 8 %
Neutro Abs: 4.7 10*3/uL (ref 1.7–7.7)
Neutrophils Relative %: 54 %
Platelets: 240 10*3/uL (ref 150–400)
RBC: 5.24 MIL/uL (ref 4.22–5.81)
RDW: 13.4 % (ref 11.5–15.5)
WBC: 8.4 10*3/uL (ref 4.0–10.5)
nRBC: 0 % (ref 0.0–0.2)

## 2021-04-05 LAB — BASIC METABOLIC PANEL
Anion gap: 10 (ref 5–15)
BUN: 13 mg/dL (ref 6–20)
CO2: 26 mmol/L (ref 22–32)
Calcium: 9.2 mg/dL (ref 8.9–10.3)
Chloride: 101 mmol/L (ref 98–111)
Creatinine, Ser: 1.1 mg/dL (ref 0.61–1.24)
GFR, Estimated: >60 mL/min (ref >60)
Glucose, Bld: 127 mg/dL — ABNORMAL HIGH (ref 70–99)
Potassium: 3.5 mmol/L (ref 3.5–5.1)
Sodium: 137 mmol/L (ref 135–145)

## 2021-04-05 MED ORDER — LORAZEPAM 2 MG/ML IJ SOLN
1.0000 mg | Freq: Once | INTRAMUSCULAR | Status: AC
  Administered 2021-04-05: 1 mg via INTRAVENOUS
  Filled 2021-04-05: qty 1

## 2021-04-05 MED ORDER — MECLIZINE HCL 25 MG PO TABS: 25.0000 mg | ORAL_TABLET | Freq: Three times a day (TID) | ORAL | 0 refills | Status: DC | PRN

## 2021-04-05 MED ORDER — SODIUM CHLORIDE 0.9 % IV BOLUS
1000.0000 mL | Freq: Once | INTRAVENOUS | Status: AC
  Administered 2021-04-05: 1000 mL via INTRAVENOUS

## 2021-04-05 MED ORDER — MECLIZINE HCL 12.5 MG PO TABS
25.0000 mg | ORAL_TABLET | Freq: Once | ORAL | Status: AC
  Administered 2021-04-05: 25 mg via ORAL
  Filled 2021-04-05: qty 2

## 2021-04-05 NOTE — ED Triage Notes (Signed)
Pt with c/o dizziness since yesterday afternoon. States room feels like it is spinning.

## 2021-04-05 NOTE — ED Provider Notes (Signed)
[facility] EMERGENCY DEPARTMENT Provider Note   CSN: [id number] Arrival date & time: 04/05/21  0104     History Chief Complaint  Patient presents with   Dizziness    Nathaniel [name] is a 25 y.o. male.  Patient presents to the emergency department for evaluation of dizziness.  Patient reports that he was feeling lightheaded earlier but did not think much of it.  He went to bed tonight and then when he woke up he felt like he was spinning in the room was spinning around him.  Patient denies headache.  He has not had any recent illness.  No numbness, tingling or weakness of extremities.  No hearing loss.   Dizziness     History reviewed. No pertinent past medical history.  Patient Active Problem List   Diagnosis Date Noted   CONTUSION OF FOREARM 03/17/2009    Past Surgical History:  Procedure Laterality Date   ANTERIOR CRUCIATE LIGAMENT REPAIR Left 07/16/2018       History reviewed. No pertinent family history.  Social History   Tobacco Use   Smoking status: Never    Passive exposure: Never   Smokeless tobacco: Never  Substance Use Topics   Alcohol use: Yes    Comment: Socially   Drug use: Never    Home Medications Prior to Admission medications   Medication Sig Start Date End Date Taking? Authorizing Provider  aspirin EC 81 MG tablet Take 81 mg by mouth daily.    [provider]  ibuprofen (ADVIL,MOTRIN) 200 MG tablet Take 200 mg by mouth every 6 (six) hours as needed.    [provider]    Allergies    Patient has no known allergies.  Review of Systems   Review of Systems  Neurological:  Positive for dizziness.  All other systems reviewed and are negative.  Physical Exam Updated Vital Signs BP (!) 162/86 (BP Location: Left Arm)   Pulse 100   Temp 98.2 F (36.8 C) (Oral)   Resp 18   Ht 6\' 4"  (1.93 m)   Wt (!) 158.8 kg   SpO2 100%   BMI 42.60 kg/m   Physical Exam Vitals and nursing note reviewed.  Constitutional:       General: He is not in acute distress.    Appearance: Normal appearance. He is well-developed.  HENT:     Head: Normocephalic and atraumatic.     Right Ear: Hearing normal.     Left Ear: Hearing normal.     Nose: Nose normal.  Eyes:     Extraocular Movements:     Right eye: Nystagmus present.     Left eye: Nystagmus present.     Conjunctiva/sclera: Conjunctivae normal.     Pupils: Pupils are equal, round, and reactive to light.     Comments: Nystagmus to the right, fatigable  Cardiovascular:     Rate and Rhythm: Regular rhythm.     Heart sounds: S1 normal and S2 normal. No murmur heard.   No friction rub. No gallop.  Pulmonary:     Effort: Pulmonary effort is normal. No respiratory distress.     Breath sounds: Normal breath sounds.  Chest:     Chest wall: No tenderness.  Abdominal:     General: Bowel sounds are normal.     Palpations: Abdomen is soft.     Tenderness: There is no abdominal tenderness. There is no guarding or rebound. Negative signs include Murphy's sign and McBurney's sign.     Hernia:  No hernia is present.  Musculoskeletal:        General: Normal range of motion.     Cervical back: Normal range of motion and neck supple.  Skin:    General: Skin is warm and dry.     Findings: No rash.  Neurological:     Mental Status: He is alert and oriented to person, place, and time.     GCS: GCS eye subscore is 4. GCS verbal subscore is 5. GCS motor subscore is 6.     Cranial Nerves: No cranial nerve deficit.     Sensory: No sensory deficit.     Coordination: Coordination normal.  Psychiatric:        Speech: Speech normal.        Behavior: Behavior normal.        Thought Content: Thought content normal.    ED Results / Procedures / Treatments   Labs (all labs ordered are listed, but only abnormal results are displayed) Labs Reviewed  CBC WITH DIFFERENTIAL/PLATELET  BASIC METABOLIC PANEL    EKG None  Radiology CT HEAD WO CONTRAST ( )  Result Date:  04/05/2021 CLINICAL DATA:  Dizziness EXAM: CT HEAD WITHOUT CONTRAST TECHNIQUE: Contiguous axial images were obtained from the base of the skull through the vertex without intravenous contrast. COMPARISON:  None. FINDINGS: Brain: No evidence of acute infarction, hemorrhage, hydrocephalus, extra-axial collection or mass lesion/mass effect. Vascular: No hyperdense vessel or unexpected calcification. Skull: Normal. Negative for fracture or focal lesion. Sinuses/Orbits: No acute finding. Other: None. IMPRESSION: No acute intracranial abnormality noted. Electronically Signed   By: Alcide Clever M.D.   On: 04/05/2021 01:47    Procedures Procedures   Medications Ordered in ED Medications  sodium chloride 0.9 % bolus 1,000 mL (has no administration in time range)  LORazepam (ATIVAN) injection 1 mg (has no administration in time range)  meclizine (ANTIVERT) tablet 25 mg (has no administration in time range)    ED Course  I have reviewed the triage vital signs and the nursing notes.  Pertinent labs & imaging results that were available during my care of the patient were reviewed by me and considered in my medical decision making (see chart for details).    MDM Rules/Calculators/A&P                           Patient presents with acute onset of vertigo after getting up from bed to go to the bathroom. Presentation is classic for peripheral vertigo and he has no stroke risk factors. Neuro exam reveals rightward gaze nystagmus with no other neuro findings. CT unremarkable. Improved after antivert and ativan. No further workup necessary, discharge with continued treatment for peripheral vertigo.  Final Clinical Impression(s) / ED Diagnoses Final diagnoses:  Peripheral vertigo involving right ear    Rx / DC Orders ED Discharge Orders     None        Gaspare Netzel, Canary Brim, MD 04/05/21 0201

## 2021-09-27 ENCOUNTER — Ambulatory Visit (INDEPENDENT_AMBULATORY_CARE_PROVIDER_SITE_OTHER): Payer: BC Managed Care – PPO | Admitting: Orthopedic Surgery

## 2021-09-27 ENCOUNTER — Ambulatory Visit (INDEPENDENT_AMBULATORY_CARE_PROVIDER_SITE_OTHER): Payer: BC Managed Care – PPO

## 2021-09-27 ENCOUNTER — Encounter: Payer: Self-pay | Admitting: Orthopedic Surgery

## 2021-09-27 VITALS — BP 154/94 | HR 74 | Ht 76.0 in | Wt 335.0 lb

## 2021-09-27 DIAGNOSIS — M25511 Pain in right shoulder: Secondary | ICD-10-CM

## 2021-09-27 DIAGNOSIS — S46011A Strain of muscle(s) and tendon(s) of the rotator cuff of right shoulder, initial encounter: Secondary | ICD-10-CM

## 2021-09-27 NOTE — Progress Notes (Addendum)
Chief Complaint  ?Patient presents with  ? Shoulder Pain  ?  Right, for two months feels like he done something lifting weights.  ? ? ?HPI: 26 year old male 400 pound benchpress was doing the exercise called a snatch in March felt acute pain and now cannot bend greater than 135 feels pain in his right shoulder with weakness comes in for evaluation ? ?The patient took anti-inflammatories and Tylenol with no leaf of pain No past medical history on file. ? ?BP (!) 154/94   Pulse 74   Ht 6\' 4"  (1.93 m)   Wt (!) 335 lb (152 kg)   BMI 40.78 kg/m?  ? ? ?General appearance: Well-developed well-nourished no gross deformities ? ?Cardiovascular normal pulse and perfusion normal color without edema ? ?Neurologically no sensation loss or deficits or pathologic reflexes ? ?Psychological: Awake alert and oriented x3 mood and affect normal ? ?Skin no lacerations or ulcerations no nodularity no palpable masses, no erythema or nodularity ? ?Musculoskeletal: Full passive and active range of motion weakness in abduction and flexion ? ?Imaging x-ray right shoulder normal xrays  ? ?A/P ? ?Recommend MRI right shoulder probably has a cuff tear ? ?Encounter Diagnoses  ?Name Primary?  ? Acute pain of right shoulder Yes  ? Traumatic complete tear of right rotator cuff, initial encounter   ? ? ?

## 2021-09-27 NOTE — Patient Instructions (Signed)
Call [phone] to schedule MRI while we are getting approval from your insurance carrier ?

## 2021-10-03 ENCOUNTER — Ambulatory Visit (HOSPITAL_COMMUNITY)
Admission: RE | Admit: 2021-10-03 | Discharge: 2021-10-03 | Disposition: A | Discharge status: Home / Self Care | Payer: BC Managed Care – PPO | Source: Ambulatory Visit | Attending: Orthopedic Surgery | Admitting: Orthopedic Surgery

## 2021-10-03 DIAGNOSIS — S46011A Strain of muscle(s) and tendon(s) of the rotator cuff of right shoulder, initial encounter: Secondary | ICD-10-CM | POA: Diagnosis present

## 2021-10-07 ENCOUNTER — Encounter: Payer: Self-pay | Admitting: Orthopedic Surgery

## 2021-10-07 ENCOUNTER — Ambulatory Visit (INDEPENDENT_AMBULATORY_CARE_PROVIDER_SITE_OTHER): Payer: BC Managed Care – PPO | Admitting: Orthopedic Surgery

## 2021-10-07 DIAGNOSIS — S46811A Strain of other muscles, fascia and tendons at shoulder and upper arm level, right arm, initial encounter: Secondary | ICD-10-CM | POA: Diagnosis not present

## 2021-10-07 MED ORDER — INDOMETHACIN 25 MG PO CAPS: 25.0000 mg | ORAL_CAPSULE | Freq: Three times a day (TID) | ORAL | 0 refills | Status: AC

## 2021-10-07 NOTE — Progress Notes (Signed)
Chief Complaint  Patient presents with   Results    Review MRI shoulder right     Encounter Diagnosis  Name Primary?   Tear of right infraspinatus tendon, initial encounter Yes   MY MRI READING posterior rotator cuff tear no labral tear  Subacromial injection was also given.  Patient can start physical therapy as the recommendation is that he rehab for 6 weeks and see if he is cuff heals.  This was discussed with him and images were shown.   Start PT   Meds ordered this encounter  Medications   indomethacin (INDOCIN) 25 MG capsule    Sig: Take 1 capsule (25 mg total) by mouth 3 (three) times daily with meals.    Dispense:  30 capsule    Refill:  0    Procedure note the subacromial injection shoulder RIGHT    Verbal consent was obtained to inject the  RIGHT   Shoulder  Timeout was completed to confirm the injection site is a subacromial space of the  RIGHT  shoulder   Medication used Depo-Medrol 40 mg and lidocaine 1% 3 cc  Anesthesia was provided by ethyl chloride  The injection was performed in the RIGHT  posterior subacromial space. After pinning the skin with alcohol and anesthetized the skin with ethyl chloride the subacromial space was injected using a 20-gauge needle. There were no complications  Sterile dressing was applied.

## 2021-10-07 NOTE — Patient Instructions (Signed)
For pain, Take indocin with food   Meds ordered this encounter  Medications   indomethacin (INDOCIN) 25 MG capsule    Sig: Take 1 capsule (25 mg total) by mouth 3 (three) times daily with meals.    Dispense:  30 capsule    Refill:  0   Start therapy

## 2021-10-08 NOTE — Addendum Note (Signed)
Addended byCandice Camp on: 10/08/2021 08:38 AM   Modules accepted: Orders

## 2021-10-19 ENCOUNTER — Ambulatory Visit (HOSPITAL_COMMUNITY): Payer: BC Managed Care – PPO | Admitting: Occupational Therapy

## 2021-10-26 ENCOUNTER — Encounter (HOSPITAL_COMMUNITY): Payer: BC Managed Care – PPO | Admitting: Occupational Therapy

## 2021-10-28 ENCOUNTER — Encounter (HOSPITAL_COMMUNITY): Payer: BC Managed Care – PPO | Admitting: Occupational Therapy

## 2021-11-25 ENCOUNTER — Ambulatory Visit: Payer: BC Managed Care – PPO | Admitting: Orthopedic Surgery

## 2021-11-25 ENCOUNTER — Encounter: Payer: Self-pay | Admitting: Orthopedic Surgery

## 2022-05-30 ENCOUNTER — Ambulatory Visit
Admission: EM | Admit: 2022-05-30 | Discharge: 2022-05-30 | Disposition: A | Discharge status: Home / Self Care | Payer: BC Managed Care – PPO | Attending: Nurse Practitioner | Admitting: Nurse Practitioner

## 2022-05-30 DIAGNOSIS — J069 Acute upper respiratory infection, unspecified: Secondary | ICD-10-CM | POA: Insufficient documentation

## 2022-05-30 DIAGNOSIS — Z1152 Encounter for screening for COVID-19: Secondary | ICD-10-CM | POA: Insufficient documentation

## 2022-05-30 MED ORDER — PSEUDOEPH-BROMPHEN-DM 30-2-10 MG/5ML PO SYRP: 5.0000 mL | ORAL_SOLUTION | Freq: Four times a day (QID) | ORAL | 0 refills | Status: AC | PRN

## 2022-05-30 MED ORDER — FLUTICASONE PROPIONATE 50 MCG/ACT NA SUSP: 2.0000 | Freq: Every day | NASAL | 0 refills | Status: AC

## 2022-05-30 NOTE — Discharge Instructions (Addendum)
COVID test is pending.  You will be contacted if the pending test result is positive.  If your COVID test is positive, you are a candidate to receive molnupiravir as antiviral therapy. Take medication as prescribed. Increase fluids and allow for plenty of rest. May take over-the-counter Tylenol as needed for pain, fever, or general discomfort. Recommend using a humidifier in your bedroom at nighttime during sleep and sleeping elevated on pillows while cough symptoms persist. If your throat reoccurs, recommend warm salt water gargles 3-4 times daily. Please be advised that a viral illness can last from 7 to 14 days. If your pending test results are negative, and symptoms fail to improve, please follow-up with your primary care physician for further evaluation. Follow-up as needed.

## 2022-05-30 NOTE — ED Triage Notes (Signed)
Pt reports cough nasal congestion, body aches x 3 days.   Requested COVID test.

## 2022-05-30 NOTE — ED Provider Notes (Signed)
[facility]    CSN: [id number] Arrival date & time: 05/30/22  0959      History   Chief Complaint Chief Complaint  Patient presents with   Cough    HPI Nathaniel [name] is a 27 y.o. male.   The history is provided by the patient.   The patient presents with a 3-day history of generalized body aches, nasal congestion, and cough.  Patient denies fever, chills, headache, chest pain, abdominal pain, nausea, vomiting, or diarrhea.  Patient states his symptoms also started with a sore throat, but that has since resolved.  Patient reports he has been vaccinated for COVID.  Denies any known sick contacts.  He reports he has been taking over-the-counter NyQuil for his symptoms.  Patient is requesting a COVID test.  History reviewed. No pertinent past medical history.  Patient Active Problem List   Diagnosis Date Noted   CONTUSION OF FOREARM 03/17/2009    Past Surgical History:  Procedure Laterality Date   ANTERIOR CRUCIATE LIGAMENT REPAIR Left 07/16/2018       Home Medications    Prior to Admission medications   Medication Sig Start Date End Date Taking? Authorizing Provider  brompheniramine-pseudoephedrine-DM 30-2-10 MG/5ML syrup Take 5 mLs by mouth 4 (four) times daily as needed. 05/30/22  Yes Xzavion Doswell-Warren, Alda Lea, NP  fluticasone (FLONASE) 50 MCG/ACT nasal spray Place 2 sprays into both nostrils daily. 05/30/22  Yes Fishel Wamble-Warren, Alda Lea, NP  indomethacin (INDOCIN) 25 MG capsule Take 1 capsule (25 mg total) by mouth 3 (three) times daily with meals. 10/07/21   Carole Civil, MD    Family History History reviewed. No pertinent family history.  Social History Social History   Tobacco Use   Smoking status: Never    Passive exposure: Never   Smokeless tobacco: Never  Substance Use Topics   Alcohol use: Yes    Comment: Socially   Drug use: Never     Allergies   Patient has no known allergies.   Review of Systems Review of Systems Per  HPI  Physical Exam Triage Vital Signs ED Triage Vitals [05/30/22 1016]  Enc Vitals Group     BP (!) 161/81     Pulse Rate 94     Resp 17     Temp 98.1 F (36.7 C)     Temp Source Oral     SpO2 95 %     Weight      Height      Head Circumference      Peak Flow      Pain Score 0     Pain Loc      Pain Edu?      Excl. in [place]?    No data found.  Updated Vital Signs BP (!) 161/81 (BP Location: Right Wrist)   Pulse 94   Temp 98.1 F (36.7 C) (Oral)   Resp 17   SpO2 95%   Visual Acuity Right Eye Distance:   Left Eye Distance:   Bilateral Distance:    Right Eye Near:   Left Eye Near:    Bilateral Near:     Physical Exam Constitutional:      General: He is not in acute distress.    Appearance: He is well-developed.  HENT:     Head: Normocephalic and atraumatic.     Right Ear: Tympanic membrane, ear canal and external ear normal.     Left Ear: Tympanic membrane, ear canal and external ear normal.  Nose: Congestion and rhinorrhea present. Rhinorrhea is clear.     Right Turbinates: Enlarged and swollen.     Left Turbinates: Enlarged and swollen.     Right Sinus: No maxillary sinus tenderness or frontal sinus tenderness.     Left Sinus: No maxillary sinus tenderness or frontal sinus tenderness.     Mouth/Throat:     Lips: Pink.     Mouth: Mucous membranes are moist.     Pharynx: Uvula midline. Posterior oropharyngeal erythema present. No pharyngeal swelling.     Tonsils: No tonsillar exudate.     Comments: Cobblestoning present on posterior oropharynx Eyes:     Conjunctiva/sclera: Conjunctivae normal.     Pupils: Pupils are equal, round, and reactive to light.  Neck:     Thyroid: No thyromegaly.     Trachea: No tracheal deviation.  Cardiovascular:     Rate and Rhythm: Normal rate and regular rhythm.     Pulses: Normal pulses.     Heart sounds: Normal heart sounds.  Pulmonary:     Effort: Pulmonary effort is normal.     Breath sounds: Normal breath sounds.   Abdominal:     General: Bowel sounds are normal. There is no distension.     Palpations: Abdomen is soft.     Tenderness: There is no abdominal tenderness.  Musculoskeletal:     Cervical back: Normal range of motion and neck supple.  Skin:    General: Skin is warm and dry.  Neurological:     General: No focal deficit present.     Mental Status: He is alert and oriented to person, place, and time.  Psychiatric:        Mood and Affect: Mood normal.        Behavior: Behavior normal.        Thought Content: Thought content normal.        Judgment: Judgment normal.      UC Treatments / Results  Labs (all labs ordered are listed, but only abnormal results are displayed) Labs Reviewed  SARS CORONAVIRUS 2 (TAT 6-24 HRS)    EKG   Radiology No results found.  Procedures Procedures (including critical care time)  Medications Ordered in UC Medications - No data to display  Initial Impression / Assessment and Plan / UC Course  I have reviewed the triage vital signs and the nursing notes.  Pertinent labs & imaging results that were available during my care of the patient were reviewed by me and considered in my medical decision making (see chart for details).  Is well-appearing, he is in no acute distress, he is hypertensive, but vital signs are otherwise stable.  COVID test is pending.  Suspect a viral upper respiratory infection with cough at this time.  Will provide symptomatic treatment with Bromfed-DM for his cough, and fluticasone 50 mcg nasal spray for his nasal congestion.  Supportive care recommendations were provided to the patient to include use of Tylenol for pain or discomfort, and use of a humidifier in the bedroom at nighttime during sleep.  Patient was advised he will be contacted if his pending test result is positive.  He is a candidate to receive molnupiravir as an antiviral therapy.  Patient verbalizes understanding.  All questions were answered.  Patient stable  for discharge.  Work note was provided.   Final Clinical Impressions(s) / UC Diagnoses   Final diagnoses:  Encounter for screening for COVID-19  Viral upper respiratory tract infection with cough  Discharge Instructions      COVID test is pending.  You will be contacted if the pending test result is positive.  If your COVID test is positive, you are a candidate to receive molnupiravir as antiviral therapy. Take medication as prescribed. Increase fluids and allow for plenty of rest. May take over-the-counter Tylenol as needed for pain, fever, or general discomfort. Recommend using a humidifier in your bedroom at nighttime during sleep and sleeping elevated on pillows while cough symptoms persist. If your throat reoccurs, recommend warm salt water gargles 3-4 times daily. Please be advised that a viral illness can last from 7 to 14 days. If your pending test results are negative, and symptoms fail to improve, please follow-up with your primary care physician for further evaluation. Follow-up as needed.      ED Prescriptions     Medication Sig Dispense Auth. Provider   brompheniramine-pseudoephedrine-DM 30-2-10 MG/5ML syrup Take 5 mLs by mouth 4 (four) times daily as needed. 140 mL Koy Lamp-Warren, Alda Lea, NP   fluticasone (FLONASE) 50 MCG/ACT nasal spray Place 2 sprays into both nostrils daily. 16 g Hunt Zajicek-Warren, Alda Lea, NP      PDMP not reviewed this encounter.   Tish Men, NP 05/30/22 1052

## 2022-05-31 ENCOUNTER — Telehealth (HOSPITAL_COMMUNITY): Payer: Self-pay | Admitting: Emergency Medicine

## 2022-05-31 LAB — SARS CORONAVIRUS 2 (TAT 6-24 HRS): SARS Coronavirus 2: POSITIVE — AB

## 2022-05-31 MED ORDER — MOLNUPIRAVIR EUA 200MG CAPSULE: 4.0000 | ORAL_CAPSULE | Freq: Two times a day (BID) | ORAL | 0 refills | Status: AC

## 2022-07-14 ENCOUNTER — Encounter: Payer: Self-pay | Admitting: Radiology

## 2022-09-14 ENCOUNTER — Encounter (HOSPITAL_COMMUNITY): Payer: Self-pay

## 2022-09-14 ENCOUNTER — Emergency Department (HOSPITAL_COMMUNITY)
Admission: EM | Admit: 2022-09-14 | Discharge: 2022-09-14 | Disposition: A | Discharge status: Home / Self Care | Payer: Self-pay | Attending: Emergency Medicine | Admitting: Emergency Medicine

## 2022-09-14 ENCOUNTER — Other Ambulatory Visit: Payer: Self-pay

## 2022-09-14 DIAGNOSIS — E876 Hypokalemia: Secondary | ICD-10-CM | POA: Insufficient documentation

## 2022-09-14 DIAGNOSIS — E860 Dehydration: Secondary | ICD-10-CM | POA: Insufficient documentation

## 2022-09-14 DIAGNOSIS — R112 Nausea with vomiting, unspecified: Secondary | ICD-10-CM | POA: Insufficient documentation

## 2022-09-14 LAB — COMPREHENSIVE METABOLIC PANEL
ALT: 27 U/L (ref 0–44)
AST: 45 U/L — ABNORMAL HIGH (ref 15–41)
Albumin: 4.2 g/dL (ref 3.5–5.0)
Alkaline Phosphatase: 29 U/L — ABNORMAL LOW (ref 38–126)
Anion gap: 9 (ref 5–15)
BUN: 13 mg/dL (ref 6–20)
CO2: 26 mmol/L (ref 22–32)
Calcium: 8.9 mg/dL (ref 8.9–10.3)
Chloride: 99 mmol/L (ref 98–111)
Creatinine, Ser: 1.19 mg/dL (ref 0.61–1.24)
GFR, Estimated: >60 mL/min (ref >60)
Glucose, Bld: 131 mg/dL — ABNORMAL HIGH (ref 70–99)
Potassium: 3.3 mmol/L — ABNORMAL LOW (ref 3.5–5.1)
Sodium: 134 mmol/L — ABNORMAL LOW (ref 135–145)
Total Bilirubin: 0.5 mg/dL (ref 0.3–1.2)
Total Protein: 7.8 g/dL (ref 6.5–8.1)

## 2022-09-14 LAB — URINALYSIS, ROUTINE W REFLEX MICROSCOPIC
Bilirubin Urine: NEGATIVE
Glucose, UA: NEGATIVE mg/dL
Hgb urine dipstick: NEGATIVE
Ketones, ur: NEGATIVE mg/dL
Leukocytes,Ua: NEGATIVE
Nitrite: NEGATIVE
Protein, ur: NEGATIVE mg/dL
Specific Gravity, Urine: 1.024 (ref 1.005–1.030)
pH: 6 (ref 5.0–8.0)

## 2022-09-14 LAB — CBC WITH DIFFERENTIAL/PLATELET
Abs Immature Granulocytes: 0.06 10*3/uL (ref 0.00–0.07)
Basophils Absolute: 0.1 10*3/uL (ref 0.0–0.1)
Basophils Relative: 1 %
Eosinophils Absolute: 0.1 10*3/uL (ref 0.0–0.5)
Eosinophils Relative: 1 %
HCT: 39.5 % (ref 39.0–52.0)
Hemoglobin: 13.2 g/dL (ref 13.0–17.0)
Immature Granulocytes: 1 %
Lymphocytes Relative: 20 %
Lymphs Abs: 1.7 10*3/uL (ref 0.7–4.0)
MCH: 27.7 pg (ref 26.0–34.0)
MCHC: 33.4 g/dL (ref 30.0–36.0)
MCV: 82.8 fL (ref 80.0–100.0)
Monocytes Absolute: 0.6 10*3/uL (ref 0.1–1.0)
Monocytes Relative: 7 %
Neutro Abs: 5.8 10*3/uL (ref 1.7–7.7)
Neutrophils Relative %: 70 %
Platelets: 211 10*3/uL (ref 150–400)
RBC: 4.77 MIL/uL (ref 4.22–5.81)
RDW: 13.3 % (ref 11.5–15.5)
WBC: 8.2 10*3/uL (ref 4.0–10.5)
nRBC: 0 % (ref 0.0–0.2)

## 2022-09-14 LAB — RAPID URINE DRUG SCREEN, HOSP PERFORMED
Amphetamines: NOT DETECTED
Barbiturates: NOT DETECTED
Benzodiazepines: NOT DETECTED
Cocaine: NOT DETECTED
Opiates: NOT DETECTED
Tetrahydrocannabinol: POSITIVE — AB

## 2022-09-14 LAB — LIPASE, BLOOD: Lipase: 28 U/L (ref 11–51)

## 2022-09-14 MED ORDER — ONDANSETRON HCL 4 MG/2ML IJ SOLN
4.0000 mg | Freq: Once | INTRAMUSCULAR | Status: AC
  Administered 2022-09-14: 4 mg via INTRAVENOUS
  Filled 2022-09-14: qty 2

## 2022-09-14 MED ORDER — POTASSIUM CHLORIDE CRYS ER 20 MEQ PO TBCR
40.0000 meq | EXTENDED_RELEASE_TABLET | Freq: Once | ORAL | Status: AC
  Administered 2022-09-14: 40 meq via ORAL
  Filled 2022-09-14: qty 2

## 2022-09-14 MED ORDER — ONDANSETRON 4 MG PO TBDP: 4.0000 mg | ORAL_TABLET | Freq: Three times a day (TID) | ORAL | 0 refills | Status: AC | PRN

## 2022-09-14 MED ORDER — ONDANSETRON HCL 4 MG PO TABS: 4.0000 mg | ORAL_TABLET | Freq: Three times a day (TID) | ORAL | 0 refills | Status: DC | PRN

## 2022-09-14 MED ORDER — LACTATED RINGERS IV BOLUS
1000.0000 mL | Freq: Once | INTRAVENOUS | Status: AC
  Administered 2022-09-14: 1000 mL via INTRAVENOUS

## 2022-09-14 NOTE — ED Notes (Signed)
Pt sleeping at present.  Mother at bedside

## 2022-09-14 NOTE — Discharge Instructions (Addendum)
Thank you for letting us take care of you today.  Overall, your labs are reassuring.  Your potassium was mildly depleted.  We gave you medication to replace this in the ED.  We also gave you fluids and medication to help with nausea.  We are likely dealing with a viral illness causing your symptoms.  We treat viruses symptomatically to help you feel better.  I prescribed a nausea medication for you to take at home to help as needed.  It is important to stay well-hydrated and drink lots of fluids.  The best fluids to drink water and electrolyte replacement solutions such as Pedialyte, Gatorade, or Powerade.  Please follow-up with primary care within the next week.  I provided 2 primary care clinics if you do not currently have a PCP.  For any new or worsening symptoms such as abdominal pain, chest pain, uncontrollable vomiting, high fevers, or other new, concerning symptoms, please return to the nearest emergency department for reevaluation.

## 2022-09-14 NOTE — ED Notes (Signed)
Pt states he feels much better.  No further nausea

## 2022-09-14 NOTE — ED Triage Notes (Signed)
Pt presents to ED from home with c/o hypertension per EMS, pt states he got dizzy and heart started beating fast, pt says EMS came and told him he may have had a rx to something he ate, BP per EMS was 145/85 and in triage is 156/83. Pt says he just "doesn't feel normal"

## 2022-09-14 NOTE — ED Provider Notes (Addendum)
[place] EMERGENCY DEPARTMENT AT [facility] Provider Note   CSN: [id number] Arrival date & time: 09/14/22  1658     History  Chief Complaint  Patient presents with   Hypertension    145/85 "per EMS"    Nathaniel [name] is a 27 y.o. male with no past medical history who presents to the ED complaining of feeling "weird" starting just prior to his mother calling EMS for transport to the hospital.  Patient with difficulty further characterizing symptoms but shortly after arrival to ED became severely nauseated with extensive vomiting.  No hematemesis.  He denies associated abdominal pain or chest pain.  He denies recent diarrhea.  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  He denies recent fever, chills, chest pain, shortness of breath, sore throat, or other symptoms.      Home Medications Prior to Admission medications   Medication Sig Start Date End Date Taking? Authorizing Provider  ondansetron (ZOFRAN) 4 MG tablet Take 1 tablet (4 mg total) by mouth every 8 (eight) hours as needed for up to 5 days for nausea or vomiting. 09/14/22 09/19/22 Yes Chayil Gantt L, PA-C  brompheniramine-pseudoephedrine-DM 30-2-10 MG/5ML syrup Take 5 mLs by mouth 4 (four) times daily as needed. 05/30/22   Leath-Warren, Sadie Haber, NP  fluticasone (FLONASE) 50 MCG/ACT nasal spray Place 2 sprays into both nostrils daily. 05/30/22   Leath-Warren, Sadie Haber, NP  indomethacin (INDOCIN) 25 MG capsule Take 1 capsule (25 mg total) by mouth 3 (three) times daily with meals. 10/07/21   Vickki Hearing, MD      Allergies    Patient has no known allergies.    Review of Systems   Review of Systems  All other systems reviewed and are negative.   Physical Exam Updated Vital Signs BP (!) 150/80   Pulse 98   Temp 99.1 F (37.3 C) (Oral)   Resp 20   Ht 6\' 4"  (1.93 m)   Wt (!) 165.6 kg   SpO2 100%   BMI 44.43 kg/m  Physical Exam Vitals and nursing note reviewed.   Constitutional:      Appearance: Normal appearance.     Comments: Actively significantly vomiting  HENT:     Head: Normocephalic and atraumatic.     Mouth/Throat:     Mouth: Mucous membranes are dry.  Eyes:     General: No scleral icterus.    Conjunctiva/sclera: Conjunctivae normal.  Cardiovascular:     Rate and Rhythm: Normal rate and regular rhythm.     Heart sounds: No murmur heard. Pulmonary:     Effort: Pulmonary effort is normal.     Breath sounds: Normal breath sounds.  Abdominal:     General: Abdomen is flat. There is no distension.     Palpations: Abdomen is soft. There is no mass.     Tenderness: There is no abdominal tenderness. There is no right CVA tenderness, left CVA tenderness, guarding or rebound.  Musculoskeletal:        General: Normal range of motion.     Cervical back: Normal range of motion and neck supple.     Right lower leg: No edema.     Left lower leg: No edema.  Skin:    General: Skin is warm and dry.     Capillary Refill: Capillary refill takes less than 2 seconds.     Coloration: Skin is not jaundiced or pale.     Findings: No rash.  Neurological:     Mental Status: He is alert. Mental status is at baseline.  Psychiatric:        Behavior: Behavior normal.     ED Results / Procedures / Treatments   Labs (all labs ordered are listed, but only abnormal results are displayed) Labs Reviewed  COMPREHENSIVE METABOLIC PANEL - Abnormal; Notable for the following components:      Result Value   Sodium 134 (*)    Potassium 3.3 (*)    Glucose, Bld 131 (*)    AST 45 (*)    Alkaline Phosphatase 29 (*)    All other components within normal limits  URINALYSIS, ROUTINE W REFLEX MICROSCOPIC - Abnormal; Notable for the following components:   APPearance CLOUDY (*)    All other components within normal limits  RAPID URINE DRUG SCREEN, HOSP PERFORMED - Abnormal; Notable for the following components:   Tetrahydrocannabinol POSITIVE (*)    All other  components within normal limits  CBC WITH DIFFERENTIAL/PLATELET  LIPASE, BLOOD    EKG None  Radiology No results found.  Procedures Procedures    Medications Ordered in ED Medications  potassium chloride SA (KLOR-CON M) CR tablet 40 mEq (has no administration in time range)  ondansetron (ZOFRAN) injection 4 mg (4 mg Intravenous Given 09/14/22 1844)  lactated ringers bolus 1,000 mL (1,000 mLs Intravenous New Bag/Given 09/14/22 1843)    ED Course/ Medical Decision Making/ A&P                             Medical Decision Making Amount and/or Complexity of Data Reviewed Labs: ordered. Decision-making details documented in ED Course.  Risk Prescription drug management.   Medical Decision Making:   Nathaniel [name] is a 27 y.o. male who presented to the ED today with abdominal pain detailed above.    Additional history discussed with patient's family/caregivers.  Complete initial physical exam performed, notably the patient  was actively vomiting. Abdomen soft, nontender, non-distended. No CVA tenderness. Dry mucus membranes. RRR, LCTA.    Reviewed and confirmed nursing documentation for past medical history, family history, social history.    Initial Assessment:   With the patient's presentation of abdominal pain, differential diagnosis includes but is not limited to AAA, mesenteric ischemia, appendicitis, diverticulitis, DKA, gastritis, gastroenteritis, AMI, nephrolithiasis, pancreatitis, peritonitis, adrenal insufficiency, intestinal ischemia, constipation, UTI, SBO/LBO, splenic rupture, biliary disease, IBD, IBS, PUD, hepatitis, STD, ovarian/testicular torsion, electrolyte disturbance, DKA, dehydration, acute kidney injury, renal failure, cholecystitis, cholelithiasis, choledocholithiasis, abdominal pain of  unknown etiology.   Initial Plan:  Screening labs including CBC and Metabolic panel to evaluate for infectious or metabolic etiology of disease.  Lipase to evaluate for  pancreatitis Urinalysis with reflex culture and UDS ordered to evaluate for UTI or relevant urologic/nephrologic pathology.  EKG to evaluate for cardiac pathology Symptomatic management Objective evaluation as reviewed   Initial Study Results:   Laboratory  Sodium 134, potassium 3.3, UDS positive for [facility]  Final Assessment and Plan:   27 year old male presenting to the ED for evaluation of nausea and vomiting.  Actively vomiting on exam.  He does not have any active pain.  Abdomen soft, nontender, nondistended and without peritoneal signs on exam.  Symptomatic treatment initiated with antiemetics and fluids to help with symptoms.  Also obtained labs for further evaluation.  No known sick contacts.  Patient is nontoxic-appearing.  Slightly hypertensive but otherwise vital signs are unremarkable.  Mild hypokalemia which was repleted.  No AKI.  Otherwise normal electrolytes, kidney function, and liver function.  Normal CBC.  On repeat examinations, patient's abdomen remains soft and nontender.  No more vomiting since receiving Zofran.  Pending fluid administration but otherwise patient appears well.  Discussed with patient possibility of imaging of his abdomen which he is comfortable foregoing at this time in the absence of any pain.  Will treat him symptomatically for likely viral syndrome with antiemetics at home and adequate hydration.  Strict ED return precautions given to patient for any change of symptoms, all questions answered, and stable for discharge.   Clinical Impression:  1. Nausea and vomiting, unspecified vomiting type   2. Dehydration   3. Hypokalemia      Discharge           Final Clinical Impression(s) / ED Diagnoses Final diagnoses:  Nausea and vomiting, unspecified vomiting type  Dehydration  Hypokalemia    Rx / DC Orders ED Discharge Orders          Ordered    ondansetron (ZOFRAN) 4 MG tablet  Every 8 hours PRN        09/14/22 1910               Tonette Lederer, PA-C 09/14/22 1852    Richardson Dopp 09/14/22 1917    Jacalyn Lefevre, MD 09/14/22 2334

## 2022-12-10 IMAGING — MR MR SHOULDER*R* W/O CM
5 series · 40 of 40 positions shown · non-contrast
Comparison: Radiographs dated September 27, 2021.

CLINICAL DATA: Shoulder pain, rotator cuff disorder suspected.

EXAM:
MRI OF THE RIGHT SHOULDER WITHOUT CONTRAST
TECHNIQUE: Multiplanar, multisequence MR imaging of the shoulder was performed.
No intravenous contrast was administered.

[Series 6: T2 fat-sat · axial · right · 4.0mm · 0.62mm/px · z∈[+20,+104]mm · 6 of 20 slices shown (1 of 3)]
[im 1/20]
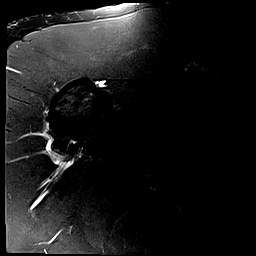
[im 4/20]
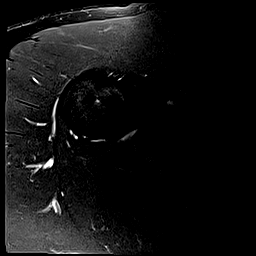
[im 8/20]
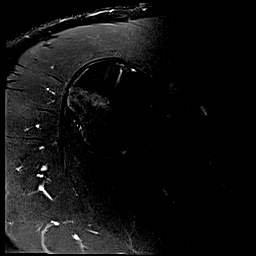
[im 12/20]
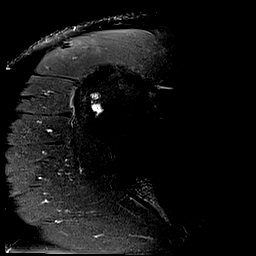
[im 16/20]
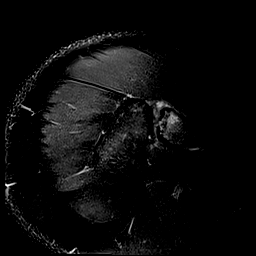
[im 20/20]
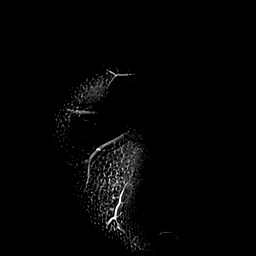

[Series 7: T2 fat-sat · sagittal · right · 4.0mm · 0.50mm/px · 7 of 20 slices shown (2 of 3)]
[im 1/20]
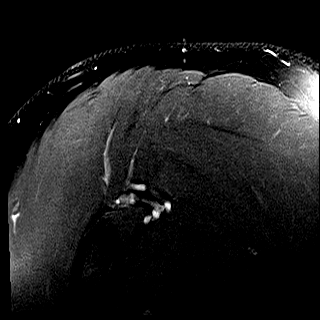
[im 4/20]
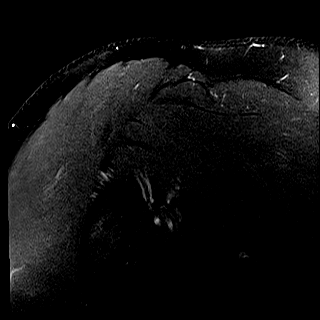
[im 7/20]
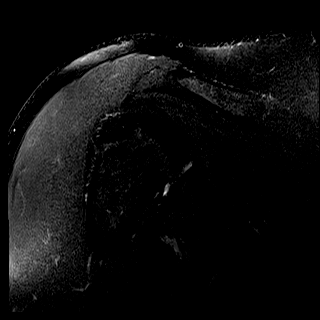
[im 10/20]
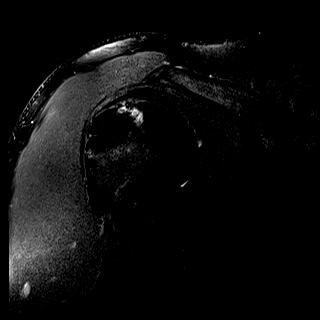
[im 13/20]
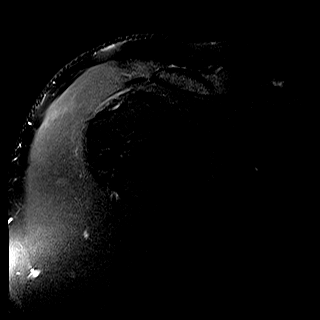
[im 16/20]
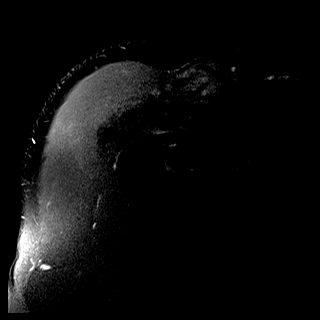
[im 20/20]
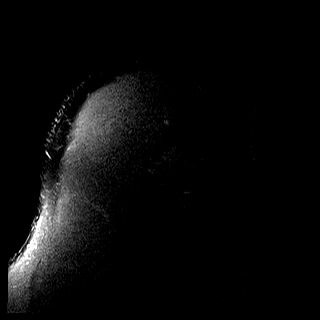

[Series 8: PD · sagittal · right · 4.0mm · 0.50mm/px · 7 of 20 slices shown]
[im 1/20]
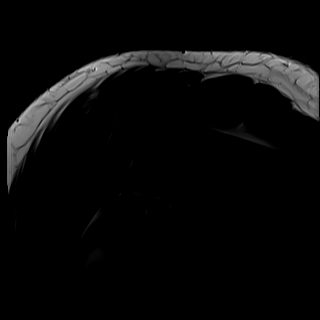
[im 4/20]
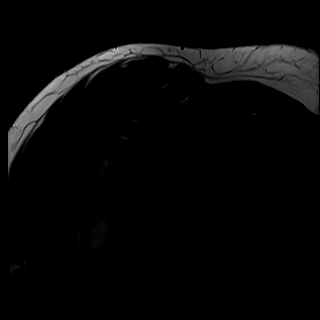
[im 7/20]
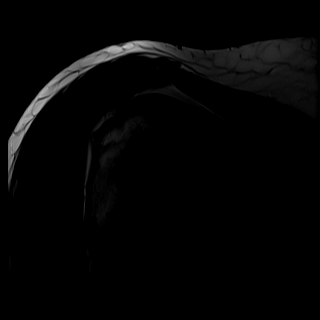
[im 10/20]
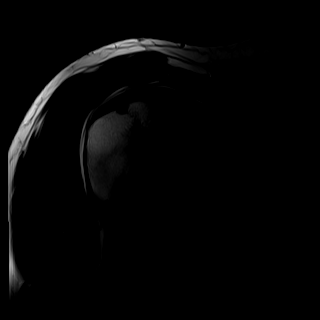
[im 13/20]
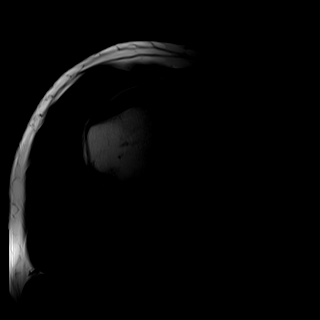
[im 16/20]
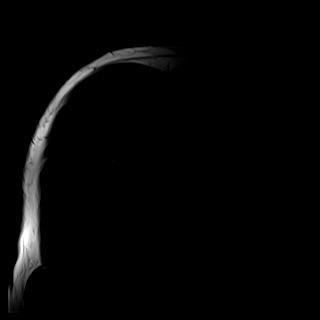
[im 20/20]
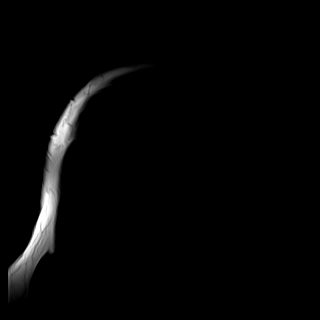

[Series 9: T1 · coronal · right · 3.0mm · 0.59mm/px · 10 of 30 slices shown]
[im 1/30]
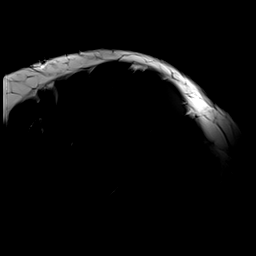
[im 4/30]
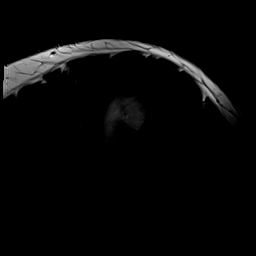
[im 7/30]
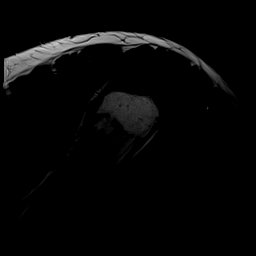
[im 10/30]
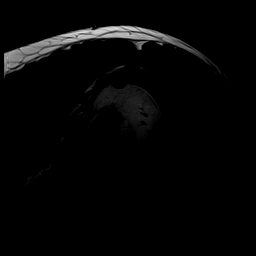
[im 13/30]
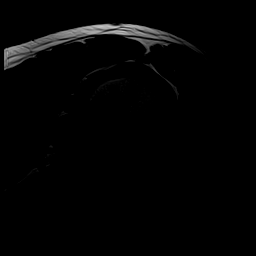
[im 17/30]
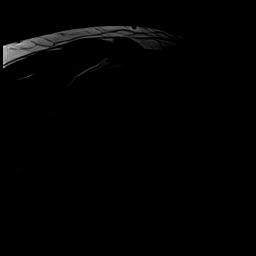
[im 20/30]
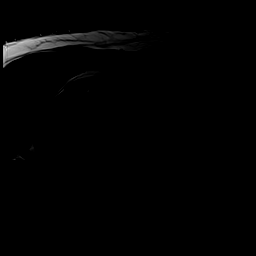
[im 23/30]
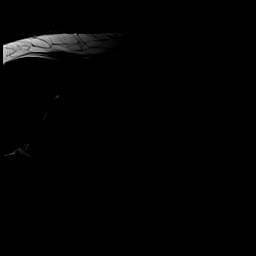
[im 26/30]
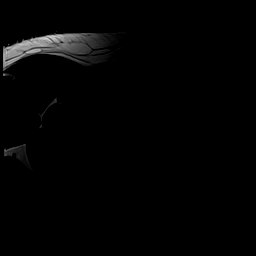
[im 30/30]
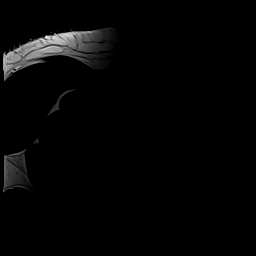

[Series 10: T2 fat-sat · coronal · right · 3.0mm · 0.59mm/px · 10 of 30 slices shown (3 of 3)]
[im 1/30]
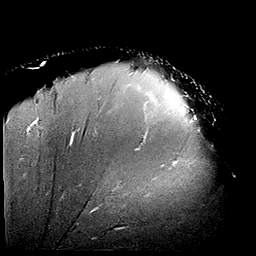
[im 4/30]
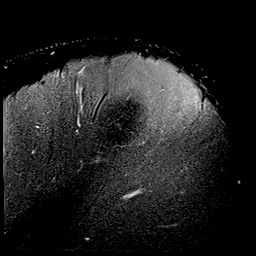
[im 7/30]
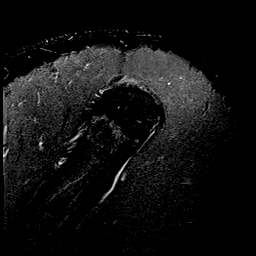
[im 10/30]
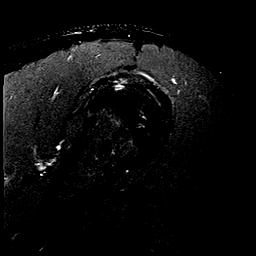
[im 13/30]
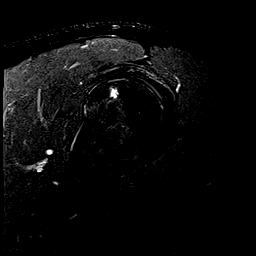
[im 17/30]
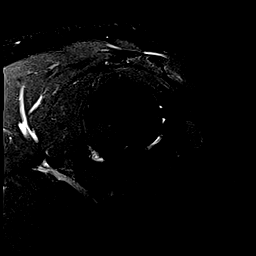
[im 20/30]
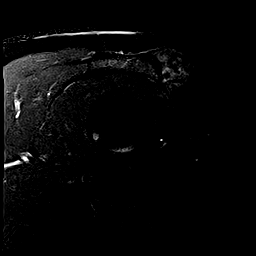
[im 23/30]
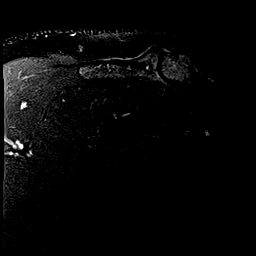
[im 26/30]
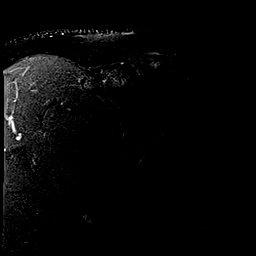
[im 30/30]
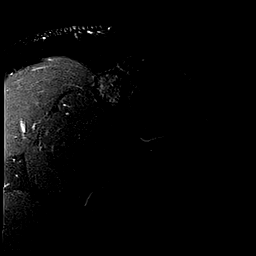

[40 of 40 positions shown; findings below may reference images not displayed]

FINDINGS: Rotator cuff: Supraspinatus tendon is intact. Mild infraspinatus
tendinopathy with low-grade partial-thickness (less than 50%)
articular surface tear. Teres minor tendon is intact. Subscapularis
tendon is intact.

Muscles: No muscle atrophy or edema. No intramuscular fluid
collection or hematoma.

Biceps Long Head: Intraarticular and extraarticular portions of the
biceps tendon are intact.

Acromioclavicular Joint: No significant arthropathy of the
acromioclavicular joint. No subacromial/subdeltoid bursal fluid.

Glenohumeral Joint: No joint effusion. No chondral defect.

Labrum: Grossly intact, but evaluation is limited by lack of
intraarticular fluid/contrast.

Bones: No fracture or dislocation. No aggressive osseous lesion.
Intraosseous cystic changes of the humeral head about the greater
tuberosity adjacent to the insertion of the infraspinatus tendon.

Other: No fluid collection or hematoma.
IMPRESSION: 1. Mild infraspinatus tendinopathy with low-grade (less than 50%)
tear of the distal infraspinatus tendon. The remaining rotator cuff
tendons are unremarkable. No fatty infiltration of the rotator cuff
muscles.

2.  No significant glenohumeral or acromioclavicular osteoarthritis.
